# Patient Record
Sex: Male | Born: 1960 | Race: Black or African American | Hispanic: No | Smoking: Current some day smoker
Health system: Southern US, Community
[De-identification: ages and names within clinical notes are randomized; demographics above are authoritative.]

## PROBLEM LIST (undated history)

## (undated) DIAGNOSIS — E119 Type 2 diabetes mellitus without complications: Secondary | ICD-10-CM

## (undated) DIAGNOSIS — G629 Polyneuropathy, unspecified: Secondary | ICD-10-CM

## (undated) HISTORY — PX: BACK SURGERY: SHX140

## (undated) HISTORY — PX: NECK SURGERY: SHX720

---

## 1998-05-13 ENCOUNTER — Ambulatory Visit (HOSPITAL_BASED_OUTPATIENT_CLINIC_OR_DEPARTMENT_OTHER): Admission: RE | Admit: 1998-05-13 | Discharge: 1998-05-13 | Payer: Self-pay | Admitting: Orthopaedic Surgery

## 1999-01-13 ENCOUNTER — Encounter: Payer: Self-pay | Admitting: Emergency Medicine

## 1999-01-13 ENCOUNTER — Emergency Department (HOSPITAL_COMMUNITY): Admission: EM | Admit: 1999-01-13 | Discharge: 1999-01-13 | Payer: Self-pay | Admitting: Emergency Medicine

## 1999-01-25 ENCOUNTER — Emergency Department (HOSPITAL_COMMUNITY): Admission: EM | Admit: 1999-01-25 | Discharge: 1999-01-25 | Payer: Self-pay | Admitting: *Deleted

## 1999-02-12 ENCOUNTER — Inpatient Hospital Stay (HOSPITAL_COMMUNITY): Admission: AD | Admit: 1999-02-12 | Discharge: 1999-02-14 | Payer: Self-pay | Admitting: Internal Medicine

## 2000-01-15 ENCOUNTER — Inpatient Hospital Stay (HOSPITAL_COMMUNITY): Admission: RE | Admit: 2000-01-15 | Discharge: 2000-01-17 | Payer: Self-pay | Admitting: Orthopaedic Surgery

## 2001-03-16 ENCOUNTER — Encounter: Payer: Self-pay | Admitting: Internal Medicine

## 2001-03-16 ENCOUNTER — Emergency Department (HOSPITAL_COMMUNITY): Admission: EM | Admit: 2001-03-16 | Discharge: 2001-03-17 | Payer: Self-pay

## 2002-07-25 ENCOUNTER — Inpatient Hospital Stay (HOSPITAL_COMMUNITY): Admission: RE | Admit: 2002-07-25 | Discharge: 2002-07-25 | Payer: Self-pay | Admitting: Orthopaedic Surgery

## 2021-05-28 ENCOUNTER — Other Ambulatory Visit: Payer: Self-pay

## 2021-05-28 ENCOUNTER — Emergency Department (HOSPITAL_COMMUNITY): Payer: Medicare HMO

## 2021-05-28 ENCOUNTER — Emergency Department (HOSPITAL_BASED_OUTPATIENT_CLINIC_OR_DEPARTMENT_OTHER): Payer: Medicare HMO

## 2021-05-28 ENCOUNTER — Encounter (HOSPITAL_BASED_OUTPATIENT_CLINIC_OR_DEPARTMENT_OTHER): Payer: Self-pay

## 2021-05-28 ENCOUNTER — Emergency Department (HOSPITAL_BASED_OUTPATIENT_CLINIC_OR_DEPARTMENT_OTHER)
Admission: EM | Admit: 2021-05-28 | Discharge: 2021-05-28 | Disposition: A | Discharge status: Home / Self Care | Payer: Medicare HMO | Attending: Emergency Medicine | Admitting: Emergency Medicine

## 2021-05-28 DIAGNOSIS — R252 Cramp and spasm: Secondary | ICD-10-CM | POA: Insufficient documentation

## 2021-05-28 DIAGNOSIS — M549 Dorsalgia, unspecified: Secondary | ICD-10-CM | POA: Diagnosis present

## 2021-05-28 DIAGNOSIS — F17290 Nicotine dependence, other tobacco product, uncomplicated: Secondary | ICD-10-CM | POA: Diagnosis not present

## 2021-05-28 DIAGNOSIS — M4802 Spinal stenosis, cervical region: Secondary | ICD-10-CM | POA: Insufficient documentation

## 2021-05-28 DIAGNOSIS — M47812 Spondylosis without myelopathy or radiculopathy, cervical region: Secondary | ICD-10-CM | POA: Insufficient documentation

## 2021-05-28 DIAGNOSIS — E119 Type 2 diabetes mellitus without complications: Secondary | ICD-10-CM | POA: Diagnosis not present

## 2021-05-28 DIAGNOSIS — M47815 Spondylosis without myelopathy or radiculopathy, thoracolumbar region: Secondary | ICD-10-CM | POA: Diagnosis not present

## 2021-05-28 DIAGNOSIS — G9589 Other specified diseases of spinal cord: Secondary | ICD-10-CM | POA: Insufficient documentation

## 2021-05-28 DIAGNOSIS — M62838 Other muscle spasm: Secondary | ICD-10-CM

## 2021-05-28 HISTORY — DX: Polyneuropathy, unspecified: G62.9

## 2021-05-28 HISTORY — DX: Type 2 diabetes mellitus without complications: E11.9

## 2021-05-28 LAB — COMPREHENSIVE METABOLIC PANEL
ALT: 23 U/L (ref 0–44)
AST: 42 U/L — ABNORMAL HIGH (ref 15–41)
Albumin: 4.5 g/dL (ref 3.5–5.0)
Alkaline Phosphatase: 70 U/L (ref 38–126)
Anion gap: 10 (ref 5–15)
BUN: 15 mg/dL (ref 8–23)
CO2: 24 mmol/L (ref 22–32)
Calcium: 9.8 mg/dL (ref 8.9–10.3)
Chloride: 103 mmol/L (ref 98–111)
Creatinine, Ser: 1.05 mg/dL (ref 0.61–1.24)
GFR, Estimated: >60 mL/min (ref >60)
Glucose, Bld: 90 mg/dL (ref 70–99)
Potassium: 3.8 mmol/L (ref 3.5–5.1)
Sodium: 137 mmol/L (ref 135–145)
Total Bilirubin: 1.3 mg/dL — ABNORMAL HIGH (ref 0.3–1.2)
Total Protein: 8 g/dL (ref 6.5–8.1)

## 2021-05-28 LAB — MAGNESIUM: Magnesium: 1.8 mg/dL (ref 1.7–2.4)

## 2021-05-28 LAB — URINALYSIS, MICROSCOPIC (REFLEX)

## 2021-05-28 LAB — URINALYSIS, ROUTINE W REFLEX MICROSCOPIC
Glucose, UA: NEGATIVE mg/dL
Leukocytes,Ua: NEGATIVE
Nitrite: NEGATIVE
Protein, ur: NEGATIVE mg/dL
Specific Gravity, Urine: 1.03 (ref 1.005–1.030)
pH: 5.5 (ref 5.0–8.0)

## 2021-05-28 LAB — RAPID URINE DRUG SCREEN, HOSP PERFORMED
Amphetamines: NOT DETECTED
Barbiturates: NOT DETECTED
Benzodiazepines: NOT DETECTED
Cocaine: POSITIVE — AB
Opiates: NOT DETECTED
Tetrahydrocannabinol: POSITIVE — AB

## 2021-05-28 LAB — CBC WITH DIFFERENTIAL/PLATELET
Abs Immature Granulocytes: 0.01 10*3/uL (ref 0.00–0.07)
Basophils Absolute: 0 10*3/uL (ref 0.0–0.1)
Basophils Relative: 0 %
Eosinophils Absolute: 0.1 10*3/uL (ref 0.0–0.5)
Eosinophils Relative: 1 %
HCT: 45 % (ref 39.0–52.0)
Hemoglobin: 15.9 g/dL (ref 13.0–17.0)
Immature Granulocytes: 0 %
Lymphocytes Relative: 45 %
Lymphs Abs: 2 10*3/uL (ref 0.7–4.0)
MCH: 32.4 pg (ref 26.0–34.0)
MCHC: 35.3 g/dL (ref 30.0–36.0)
MCV: 91.8 fL (ref 80.0–100.0)
Monocytes Absolute: 0.5 10*3/uL (ref 0.1–1.0)
Monocytes Relative: 11 %
Neutro Abs: 2 10*3/uL (ref 1.7–7.7)
Neutrophils Relative %: 43 %
Platelets: 188 10*3/uL (ref 150–400)
RBC: 4.9 MIL/uL (ref 4.22–5.81)
RDW: 13.4 % (ref 11.5–15.5)
WBC: 4.5 10*3/uL (ref 4.0–10.5)
nRBC: 0 % (ref 0.0–0.2)

## 2021-05-28 LAB — VITAMIN B12: Vitamin B-12: 590 pg/mL (ref 180–914)

## 2021-05-28 LAB — ETHANOL: Alcohol, Ethyl (B): <10 mg/dL (ref <10)

## 2021-05-28 LAB — AMMONIA: Ammonia: 21 umol/L (ref 9–35)

## 2021-05-28 IMAGING — MR MR CERVICAL SPINE W/O CM
4 of 6 series · 18 of 48 positions shown · non-contrast
Comparison: Cervical spine radiographs [DATE], cervical spine
MRI [DATE]

CLINICAL DATA: Myelopathy

EXAM:
MRI CERVICAL SPINE WITHOUT CONTRAST
TECHNIQUE: Multiplanar, multisequence MR imaging of the cervical spine was
performed. No intravenous contrast was administered.

[Series 2: T2 · sagittal · 3.0mm · 0.43mm/px · 4 of 18 slices shown (1 of 3)]
[im 1/18]
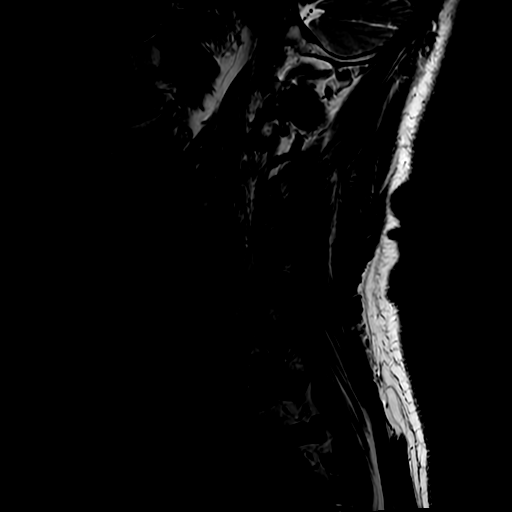
[im 6/18]
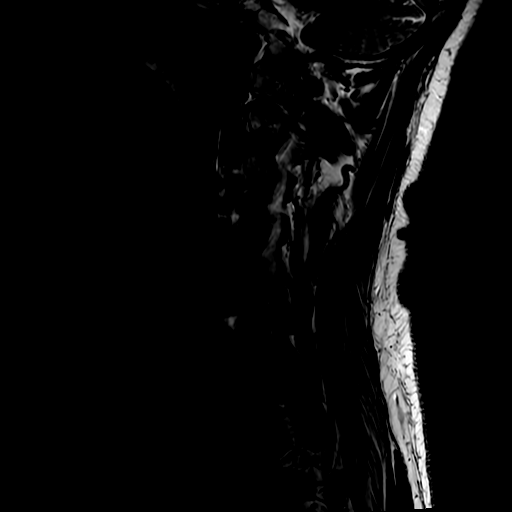
[im 12/18]
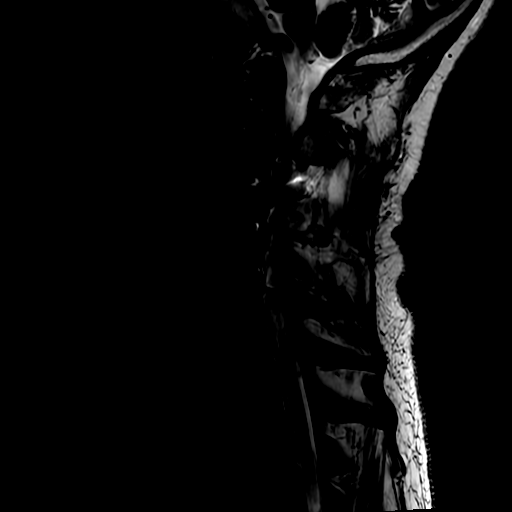
[im 18/18]
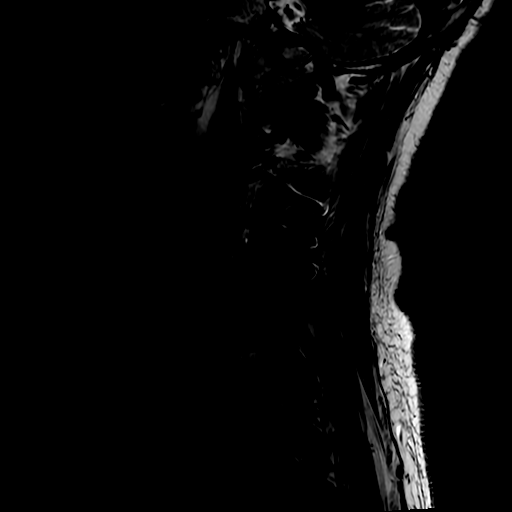

[Series 3: T2 · sagittal · 3.0mm · 0.43mm/px · 3 of 18 slices shown (2 of 3)]
[im 1/18]
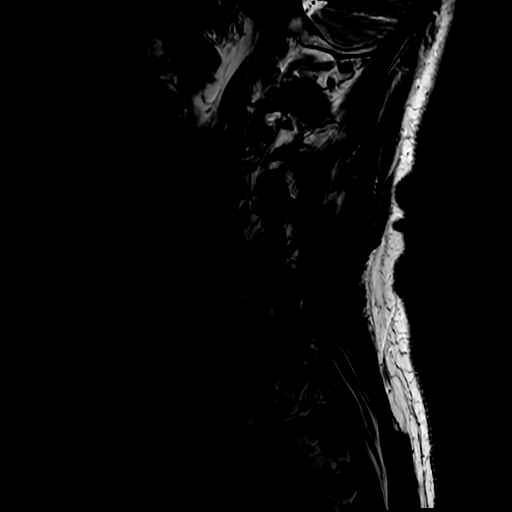
[im 9/18]
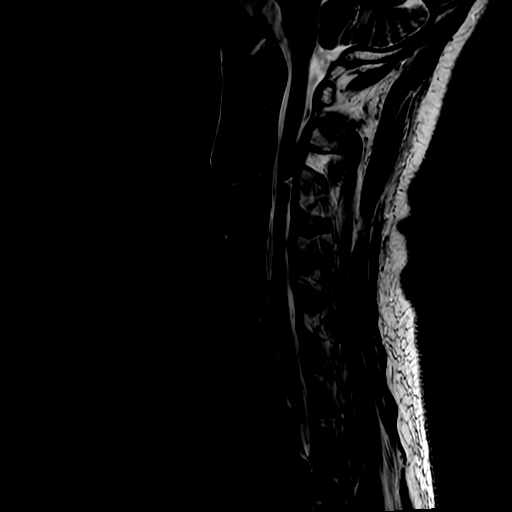
[im 18/18]
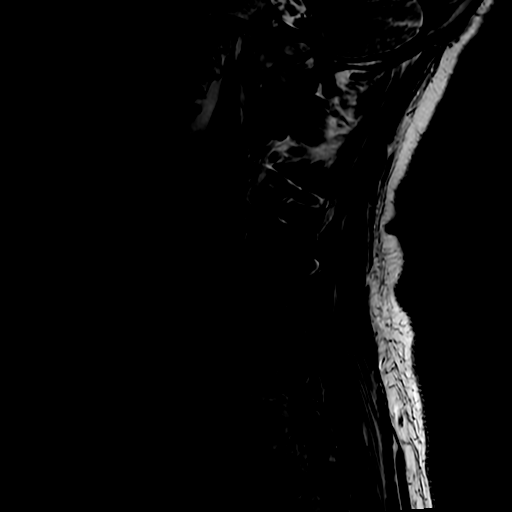

[Series 5: STIR · sagittal · 3.0mm · 0.43mm/px · 3 of 18 slices shown]
[im 1/18]
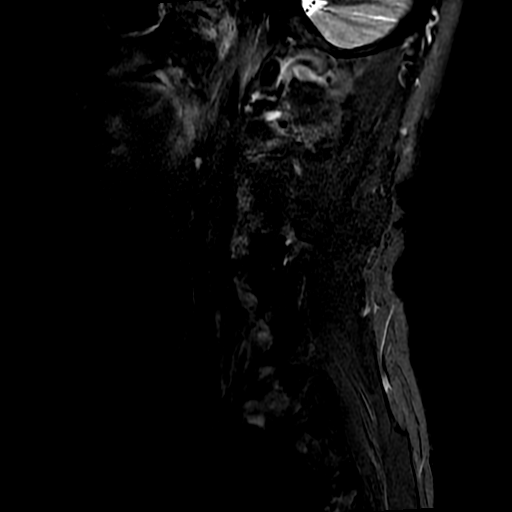
[im 9/18]
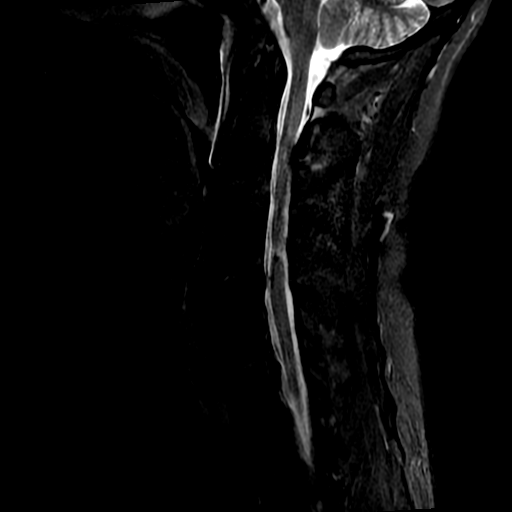
[im 18/18]
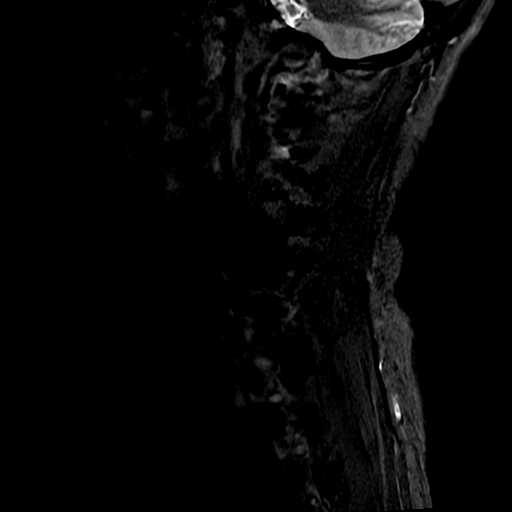

[Series 7: T2 · axial · 3.0mm · 0.35mm/px · z∈[-106,+29]mm · 8 of 49 slices shown (3 of 3)]
[im 1/49]
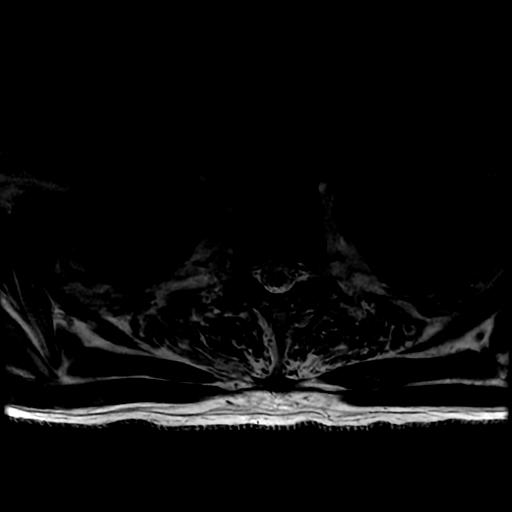
[im 7/49]
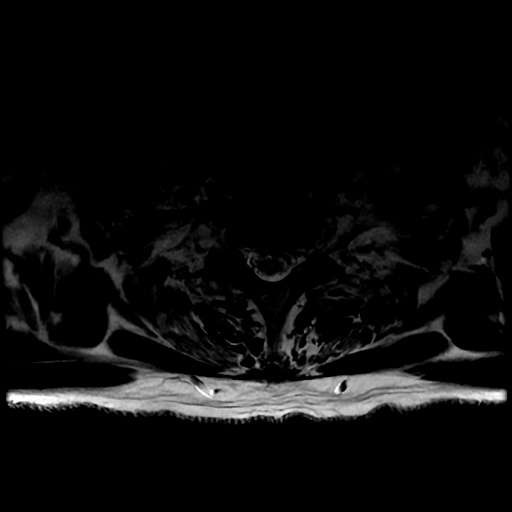
[im 13/49]
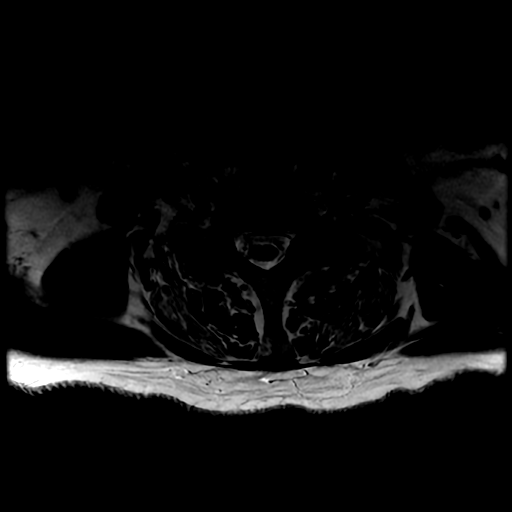
[im 19/49]
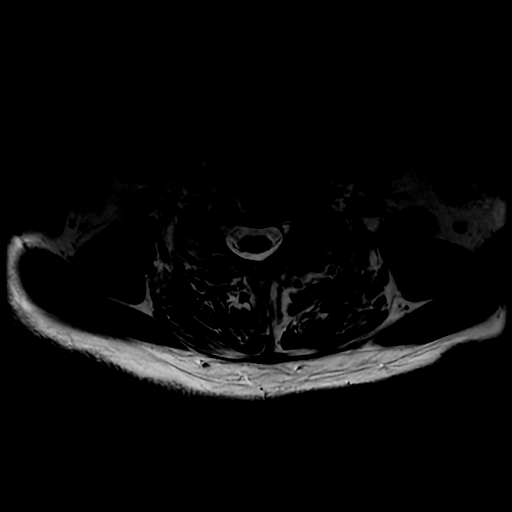
[im 25/49]
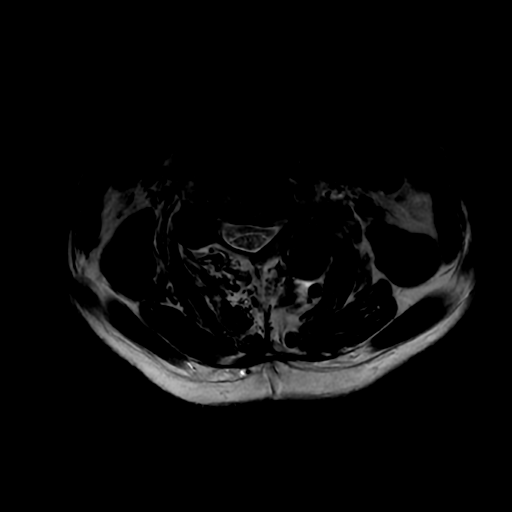
[im 31/49]
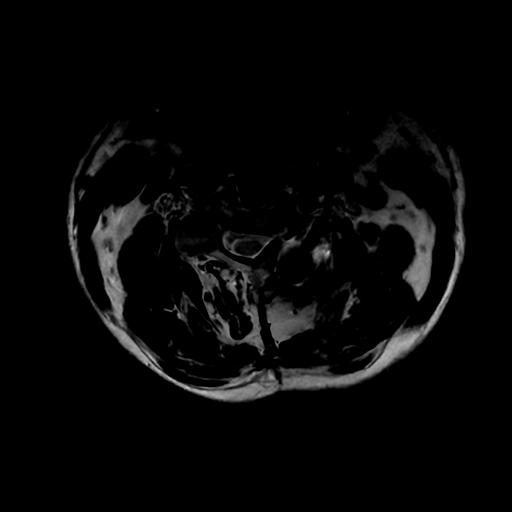
[im 37/49]
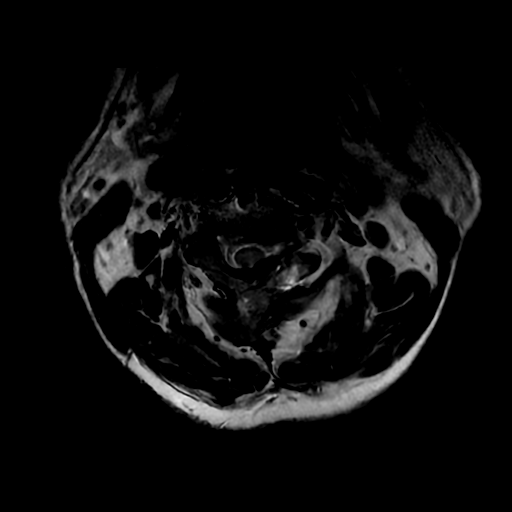
[im 43/49]
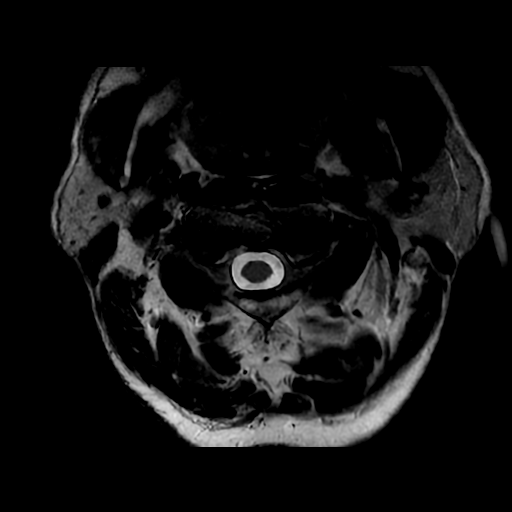

[18 of 48 positions shown; findings below may reference images not displayed]

FINDINGS: Alignment: Normal.

Vertebrae: The patient is status post ACDF at C3 through C5 and
C6-C7. There appears to be good osseous fusion across the fused
levels. Left posterior fusion hardware is also seen at C3 through
C5. Marrow signal is normal, allowing for these postsurgical
changes. Vertebral body heights are preserved.

Cord: Myelomalacia is again seen at C5, similar to the prior study.
There is no new cord signal abnormality.

Posterior Fossa, vertebral arteries, paraspinal tissues: The imaged
portions of the posterior fossa are unremarkable. The vertebral
artery flow voids are present. The paraspinal soft tissues are
unremarkable.

Disc levels:

There is disc desiccation and narrowing at the unfused levels,
similar to the prior study from [82].

C2-C3: There is left-sided ligamentum flavum thickening and
uncovertebral and facet arthropathy resulting in severe left and
moderate right neural foraminal stenosis and mild spinal canal
stenosis, progressed since [82].

C3-C4: There is solid fusion at this level. There is uncovertebral
and facet arthropathy resulting in moderate bilateral neural
foraminal stenosis without significant spinal canal stenosis, not
significantly changed.

C4-C5: There is solid fusion at this level. There is left worse than
right facet arthropathy resulting in severe left and moderate right
neural foraminal stenosis, not significantly changed.

C5-C6: There is a mild posterior disc osteophyte complex and
uncovertebral and facet arthropathy resulting in severe left worse
than right neural foraminal stenosis without significant spinal
canal stenosis, not significantly changed.

C6-C7: There is solid fusion at this level. There is bilateral facet
arthropathy resulting in mild-to-moderate bilateral neural foraminal
stenosis without significant spinal canal stenosis., similar to the
prior study.

C7-T1: There is uncovertebral and facet arthropathy resulting in
severe left and moderate right neural foraminal stenosis without
significant spinal canal stenosis.

Other: There is a 0.9 cm right thyroid nodule. No specific follow-up
is required.
IMPRESSION: 1. Postsurgical changes reflecting C3 through C5 and C6-C7 ACDF and
posterior fusion on the left at C3 through C5. Hardware alignment is
within expected limits.
2. Unchanged chronic myelomalacia at C5. No new or progressive cord
signal abnormality. No significant spinal canal stenosis.
3. Multilevel severe neural foraminal stenosis including on the left
at C2-C3 and C4-C5, bilaterally at C5-C6, and on the left at C7-T1.
This is progressed on the left at C2-C3 since [82] but not
significantly changed at the other levels.

## 2021-05-28 IMAGING — MR MR THORACIC SPINE W/O CM
4 of 6 series · 18 of 48 positions shown · non-contrast
Comparison: MRI from [DATE].

CLINICAL DATA: Initial evaluation for myelopathy, acute or
progressive.

EXAM:
MRI THORACIC SPINE WITHOUT CONTRAST
TECHNIQUE: Multiplanar, multisequence MR imaging of the thoracic spine was
performed. No intravenous contrast was administered.

[Series 2: T1 · sagittal · 3.0mm · 0.90mm/px · 3 of 16 slices shown (1 of 2)]
[im 1/16]
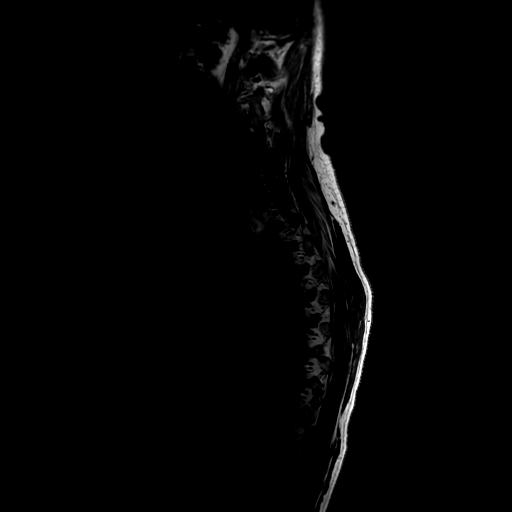
[im 11/16]
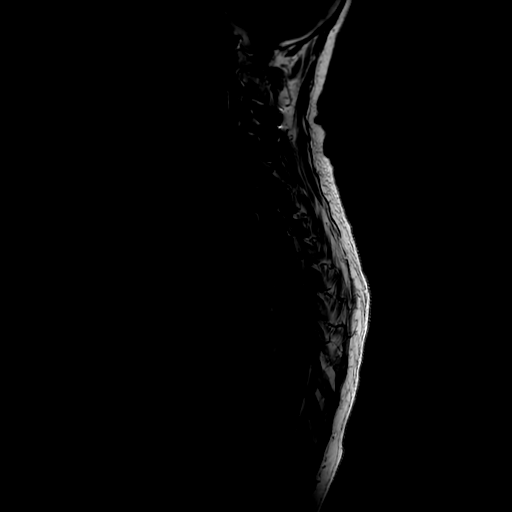
[im 16/16]
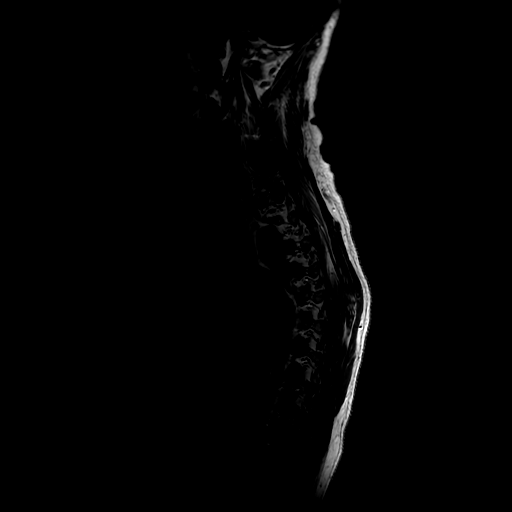

[Series 3: T2 · sagittal · 3.0mm · 0.66mm/px · 4 of 15 slices shown (1 of 2)]
[im 1/15]
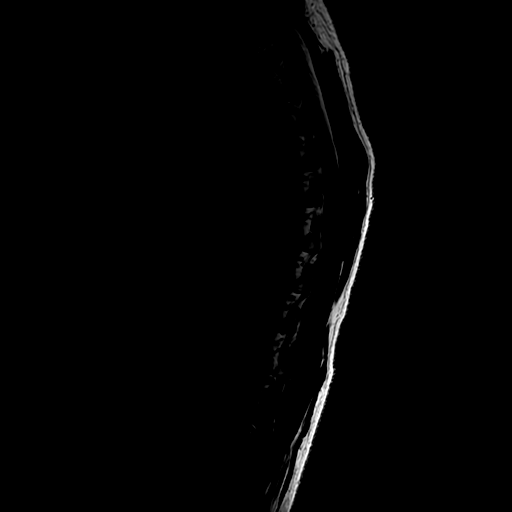
[im 5/15]
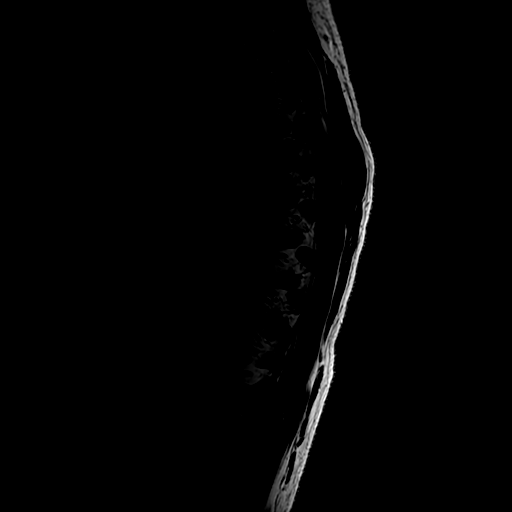
[im 10/15]
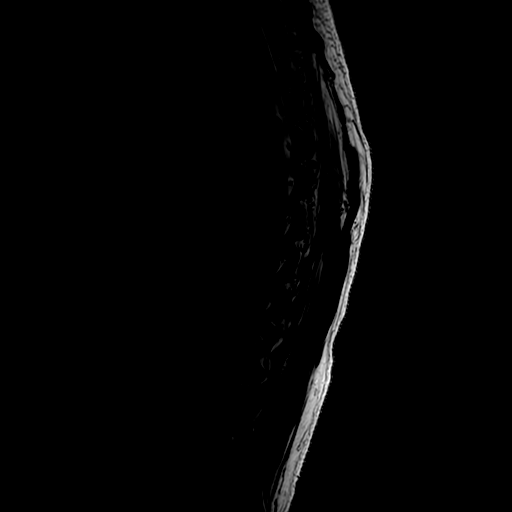
[im 15/15]
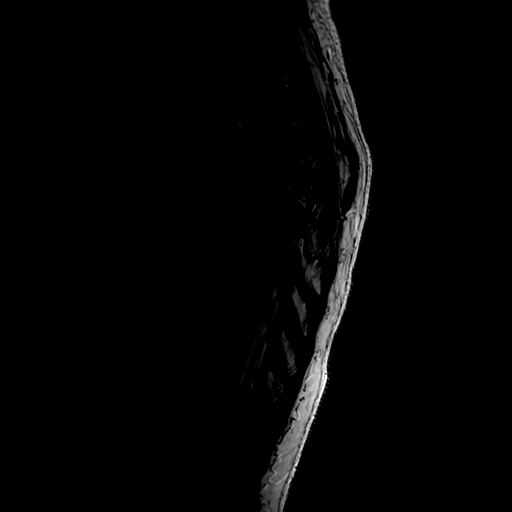

[Series 5: T1 · sagittal · 3.0mm · 0.66mm/px · 3 of 15 slices shown (2 of 2)]
[im 1/15]
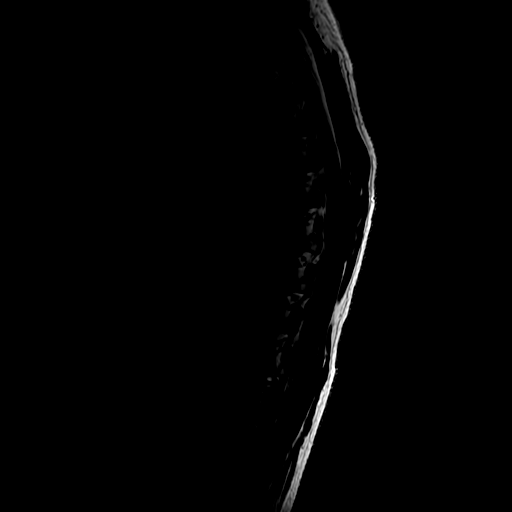
[im 10/15]
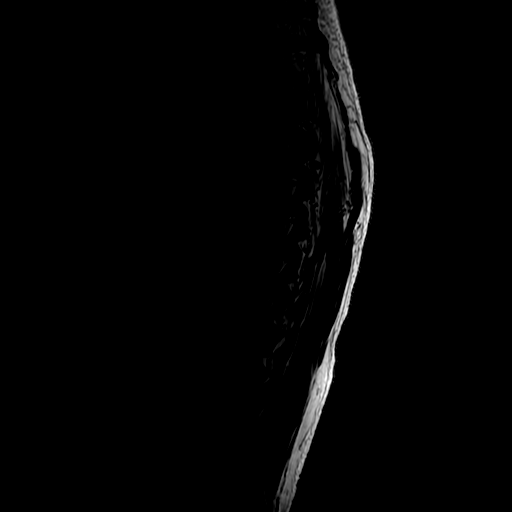
[im 15/15]
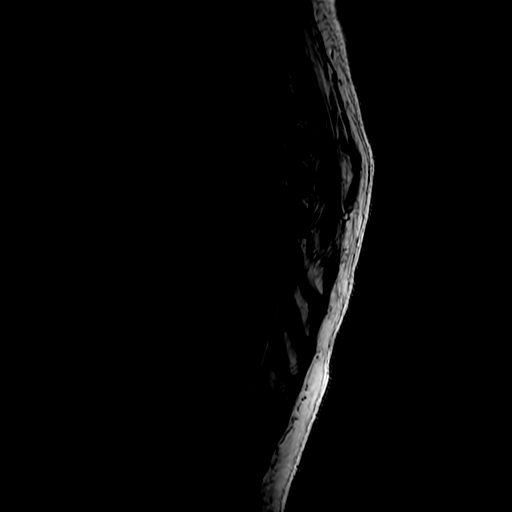

[Series 6: T2 · axial · 4.0mm · 0.39mm/px · z∈[-354,-99]mm · 8 of 60 slices shown (2 of 2)]
[im 4/60]
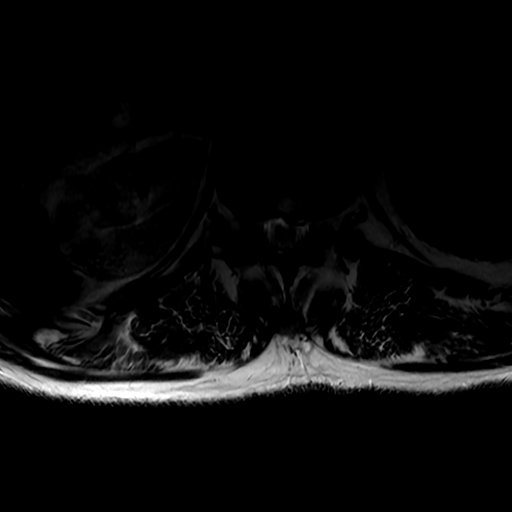
[im 8/60]
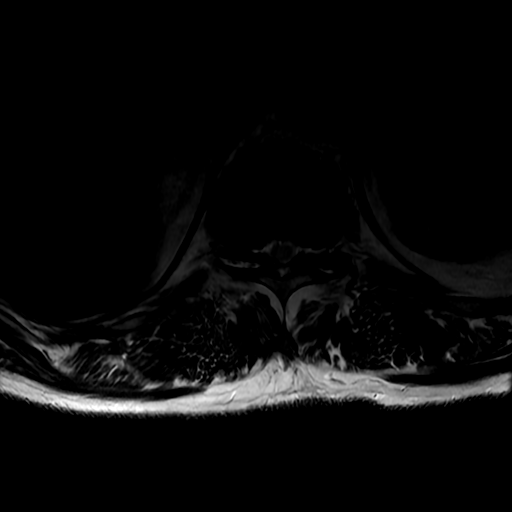
[im 12/60]
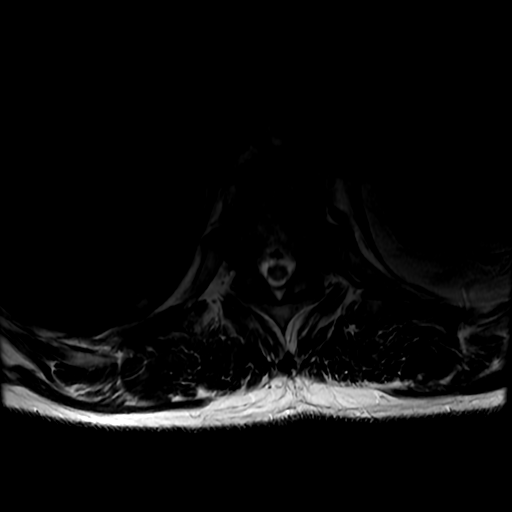
[im 20/60]
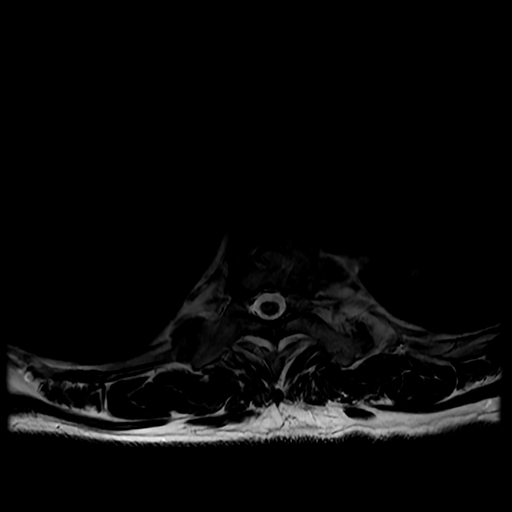
[im 28/60]
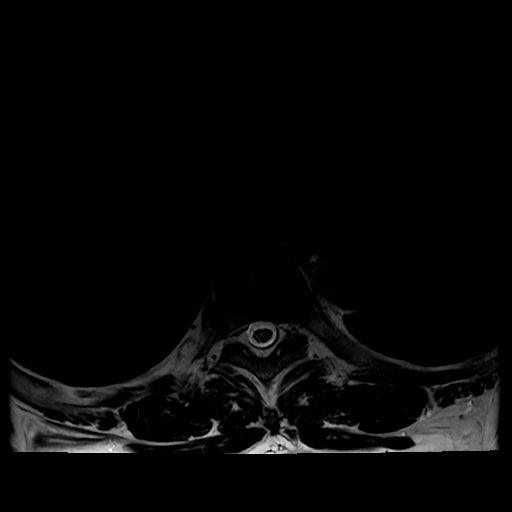
[im 32/60]
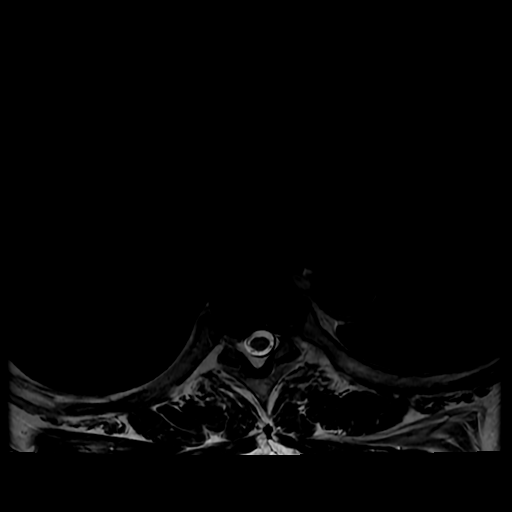
[im 36/60]
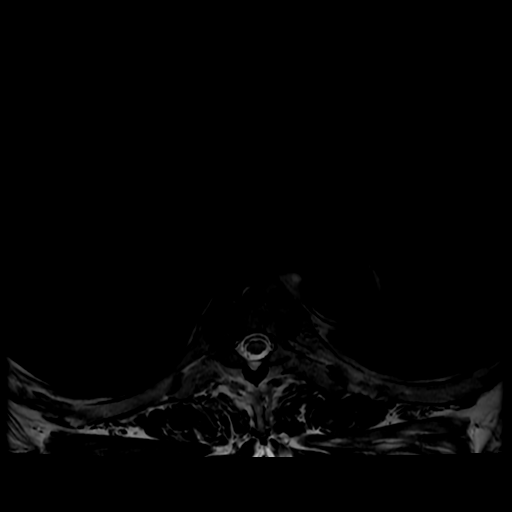
[im 52/60]
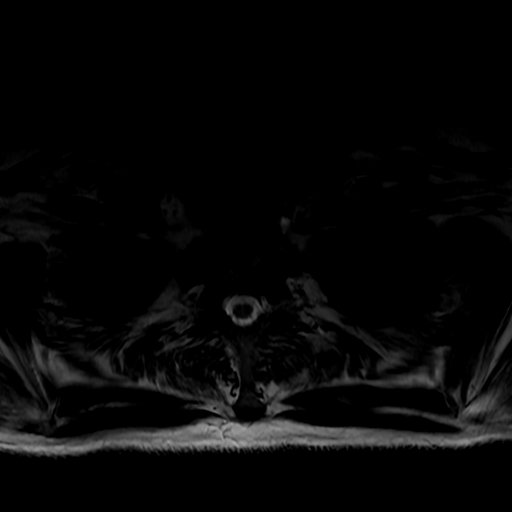

[18 of 48 positions shown; findings below may reference images not displayed]

FINDINGS: Alignment: Trace anterolisthesis of T11 on T12, chronic and facet
mediated. Alignment otherwise normal preservation of the normal
thoracic kyphosis.

Vertebrae: Vertebral body height maintained without acute or chronic
fracture. Bone marrow signal intensity within normal limits. 1.7 cm
hemangioma noted within the T10 vertebral body. No other discrete or
worrisome osseous lesions. No abnormal marrow edema.

Cord:  Normal signal and morphology.

Paraspinal and other soft tissues: Paraspinous soft tissues within
normal limits. Few cystic lesions seen within the visualized liver,
largest of which measures 1.2 cm.

Disc levels:

T1-2: Minimal disc bulge with bilateral facet hypertrophy. No spinal
stenosis. Moderate right foraminal narrowing.

T2-3: Disc bulge. Right worse than left facet hypertrophy. No spinal
stenosis. Foramina remain patent.

T3-4: Mild disc bulge with posterior element hypertrophy. No
stenosis.

T4-5: Minimal disc bulge with posterior element hypertrophy. No
stenosis.

T5-6: Minimal disc bulge with posterior element hypertrophy. No
stenosis.

T6-7: Small right paracentral disc protrusion minimally indents the
right ventral thecal sac. No spinal stenosis or cord impingement.
Foramina remain patent.

T7-8: Disc desiccation with mild posterior element hypertrophy. No
stenosis.

T8-9: Disc desiccation with mild posterior hypertrophy. No stenosis.

T9-10: Mild disc bulge with bilateral facet hypertrophy. No
significant stenosis.

T10-11: Disc bulge with disc desiccation and reactive endplate
change. Disc bulging slightly eccentric to the left. Bilateral facet
hypertrophy. No significant spinal stenosis. Foramina remain patent.

T11-12: Trace anterolisthesis. Diffuse disc bulge with reactive
endplate spurring. Moderate bilateral facet hypertrophy with
associated small joint effusions. Resultant mild-to-moderate spinal
stenosis. Minimal cord flattening without cord signal changes.
Moderate right with mild left foraminal narrowing.

T12-L1: Disc bulge with disc desiccation. Superimposed left
paracentral disc protrusion indents the left ventral thecal sac
(series 6, image 57). Bilateral facet hypertrophy. Resultant mild to
moderate spinal stenosis, greater on the left. Foramina remain
patent.
IMPRESSION: 1. No acute abnormality within the thoracic spine. No evidence for
cord compression or myelopathy.
2. Multilevel thoracic spondylosis and facet arthrosis as above,
most pronounced at T11-12 and T12-L1 where there is resultant
mild-to-moderate spinal stenosis. Associated moderate foraminal
narrowing on the right at T1-2 and T11-12.

## 2021-05-28 IMAGING — CT CT L SPINE W/O CM
3 series · 9 of 33 positions shown, 10 images · non-contrast
Comparison: None.

CLINICAL DATA: Low back pain with progressive neurological deficit.
Neuropathy of both lower extremities.

EXAM:
CT LUMBAR SPINE WITHOUT CONTRAST
TECHNIQUE: Multidetector CT imaging of the lumbar spine was performed without
intravenous contrast administration. Multiplanar CT image
reconstructions were also generated.

[Series 4: l spine soft · axial · 0.28mm/px · z∈[+1060,+1060]mm · 1 of 128 slices shown, 2 images]
[im 69/128  soft-tissue]
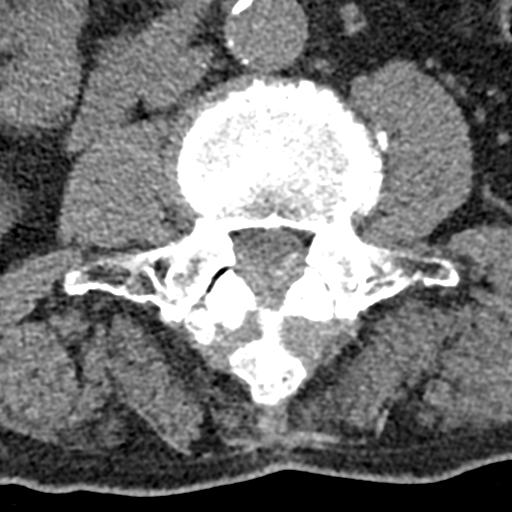
[im 69/128  bone]
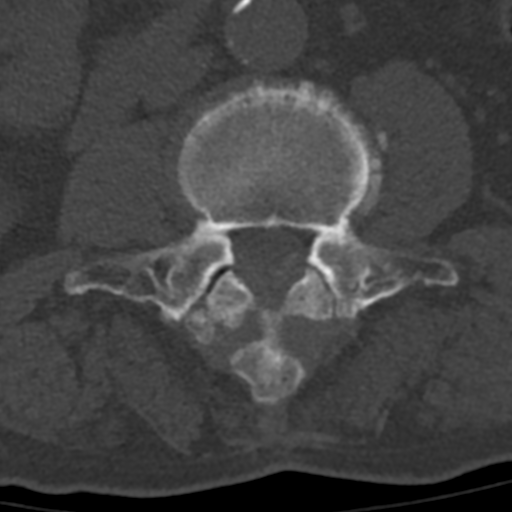

[Series 7: sagittal soft · sagittal · 0.24mm/px · 5 of 74 slices shown]
[im 25/74  bone]
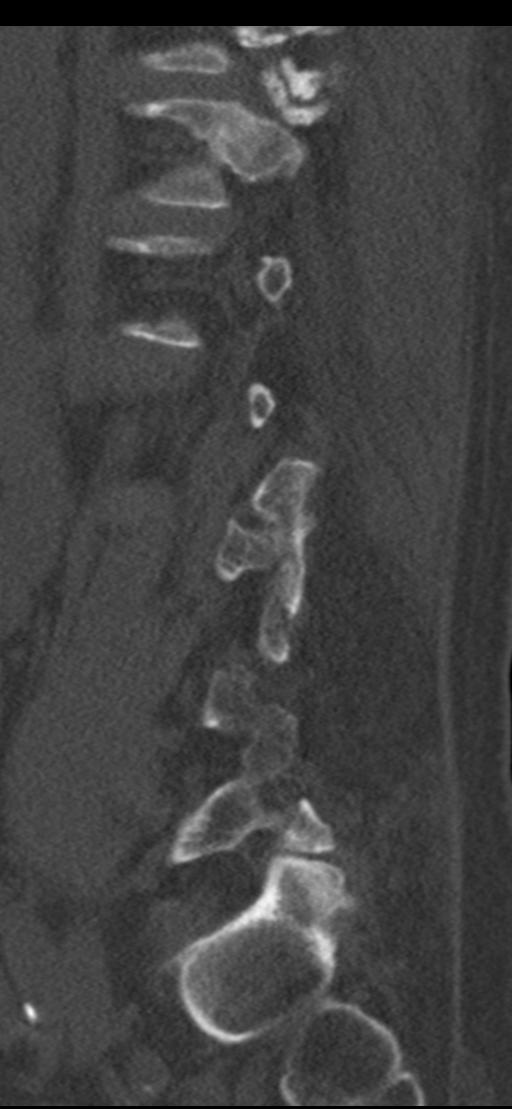
[im 31/74  bone]
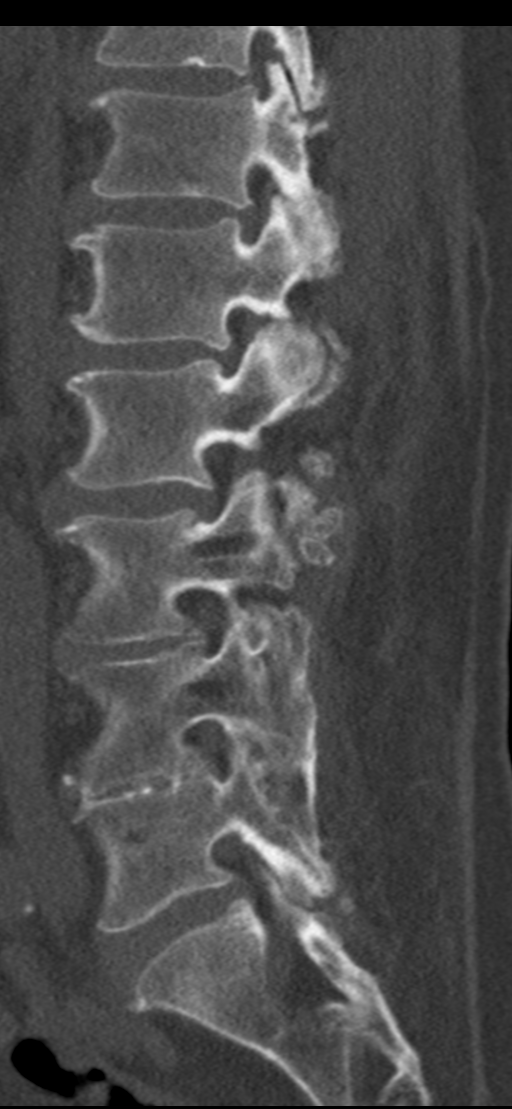
[im 37/74  bone]
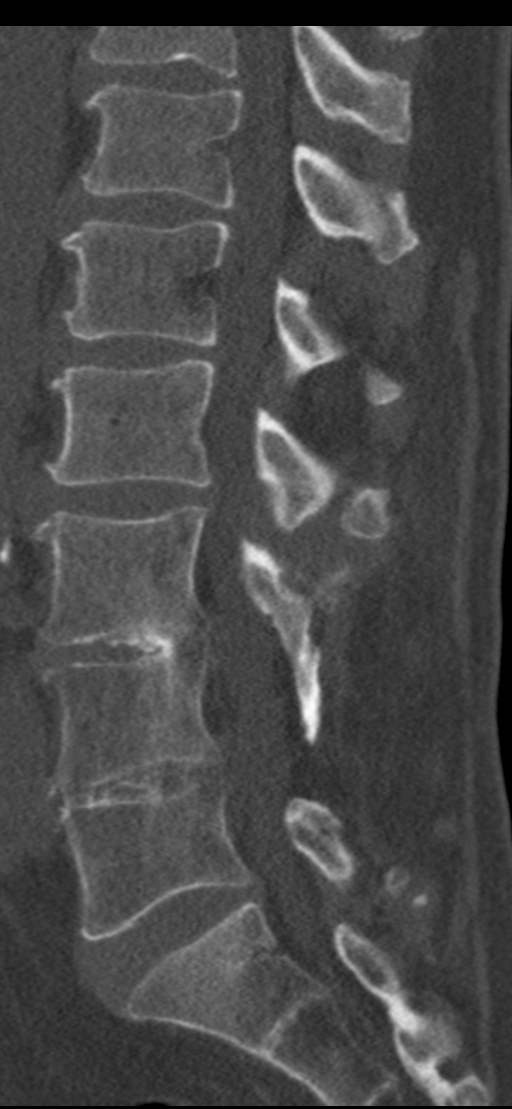
[im 43/74  bone]
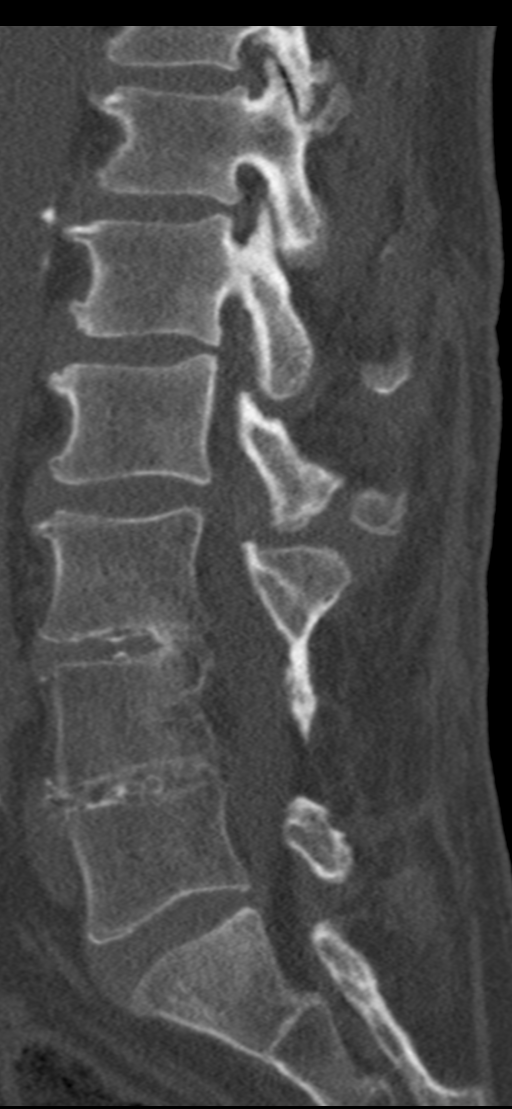
[im 49/74  bone]
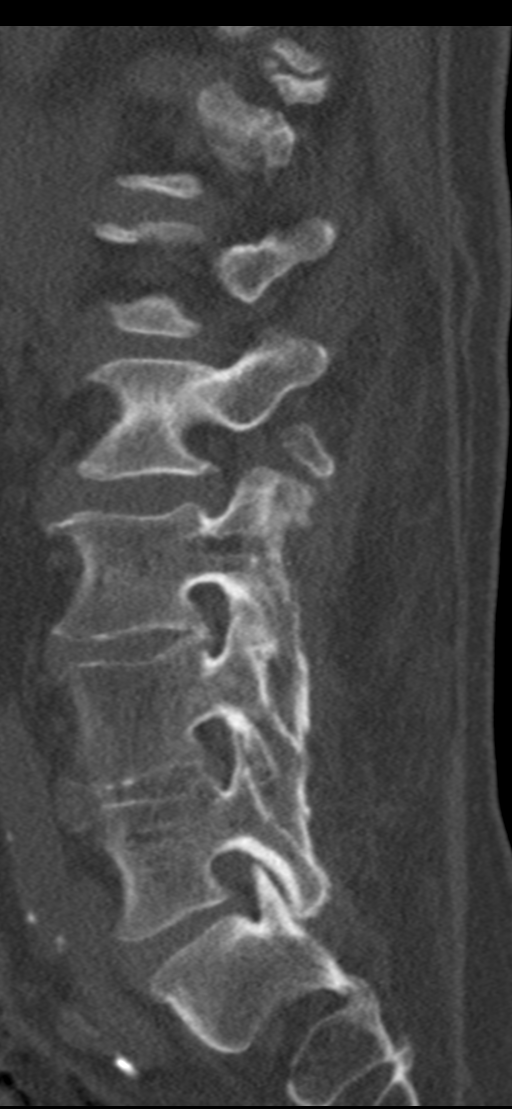

[Series 8: coronal bone · coronal · 0.23mm/px · 3 of 51 slices shown]
[im 11/51  bone]
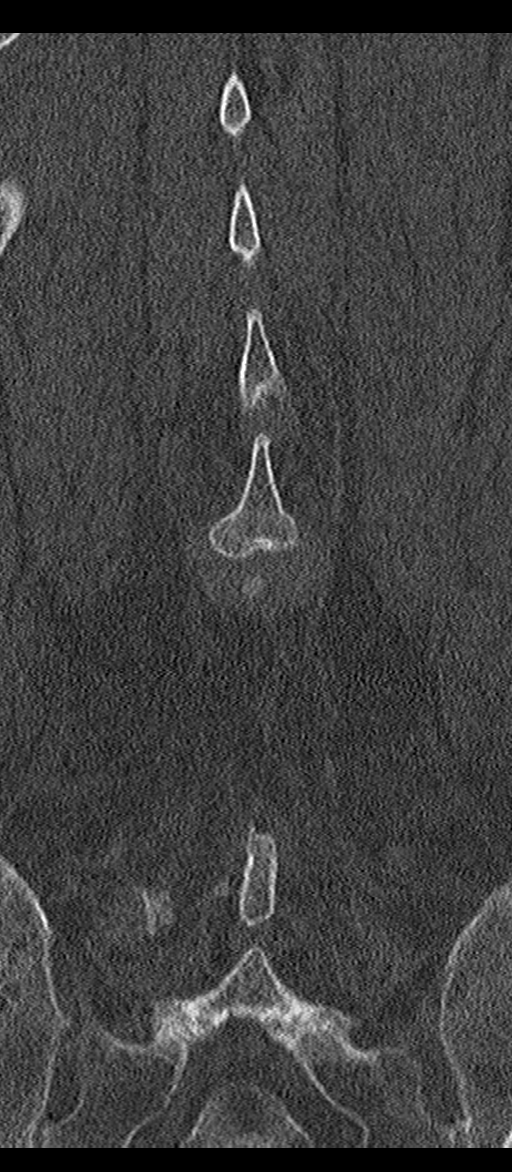
[im 21/51  bone]
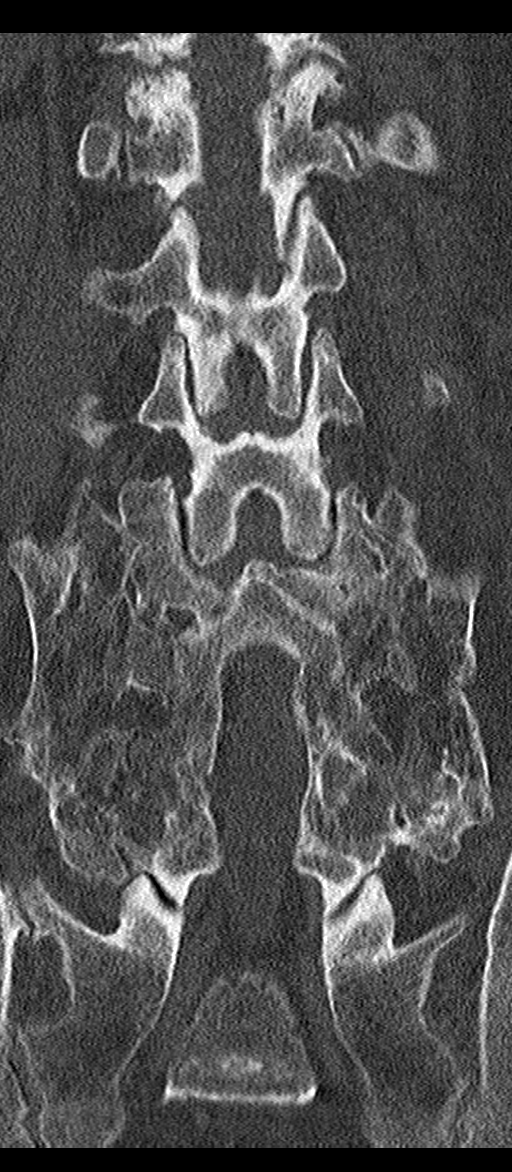
[im 31/51  bone]
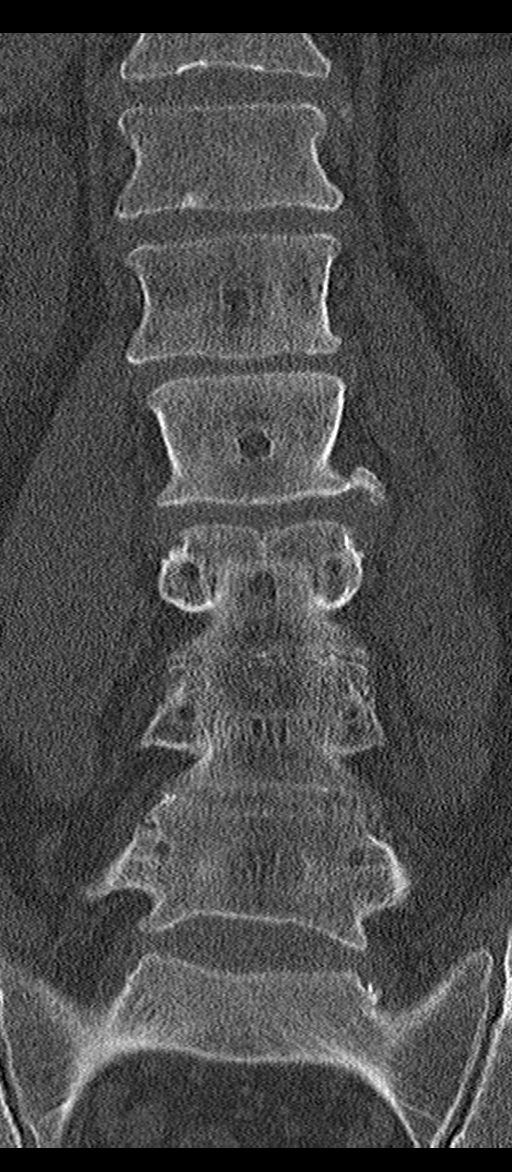

[9 of 33 positions shown; findings below may reference images not displayed]

FINDINGS: Segmentation: 5 lumbar type vertebral bodies assumed.

Alignment: No malalignment.

Vertebrae: Previous fusion procedure from L3 through L5 with
hardware removal. See below.

Paraspinal and other soft tissues: Negative

Disc levels: T11-12: Facet osteoarthritis with 1 or 2 mm of
anterolisthesis. Bulging of the disc. Facet and ligamentous
hypertrophy. Moderate canal and foraminal stenosis.

T12-L1: Bulging of the disc. Mild facet and ligamentous prominence.
Mild canal stenosis.

L1-2: Bulging of the disc. Mild facet and ligamentous hypertrophy.
Mild canal stenosis.

L2-3: Moderate bulging of the disc. Facet and ligamentous
hypertrophy. Moderate multifactorial stenosis that could be
symptomatic.

L3 through L5: Previous posterior decompression, diskectomy and
fusion. Solid union throughout this region with sufficient patency
of the canal and foramina.

L5-S1: Mild bulging of the disc. Bilateral facet osteoarthritis. No
compressive stenosis.
IMPRESSION: Previous posterior decompression, diskectomy and fusion procedure
from L3 through L5. Previous hardware removal. Solid union with
sufficient patency of the canal and foramina.

L2-3: Circumferential bulging of the disc. Facet and ligamentous
hypertrophy. Multifactorial spinal stenosis at this level that could
be symptomatic.

L1-2 and T12-L1: Mild disc bulges and facet hypertrophy. Mild
stenosis, not grossly compressive.

T11-12: Facet arthropathy with 1 or 2 mm of anterolisthesis. Bulging
of the disc. Canal and foraminal stenosis that could possibly be
symptomatic.

L5-S1: Minimal disc bulge. Facet osteoarthritis. No compressive
stenosis. The facet arthritis could be painful.

## 2021-05-28 IMAGING — MR MR THORACIC SPINE W/O CM
4 series · 18 of 48 positions shown · non-contrast
Comparison: MRI from [DATE].

CLINICAL DATA: Initial evaluation for myelopathy, acute or
progressive.

EXAM:
MRI THORACIC SPINE WITHOUT CONTRAST
TECHNIQUE: Multiplanar, multisequence MR imaging of the thoracic spine was
performed. No intravenous contrast was administered.

[Series 2: T1 · sagittal · 3.0mm · 0.90mm/px · 3 of 21 slices shown (1 of 2)]
[im 4/21]
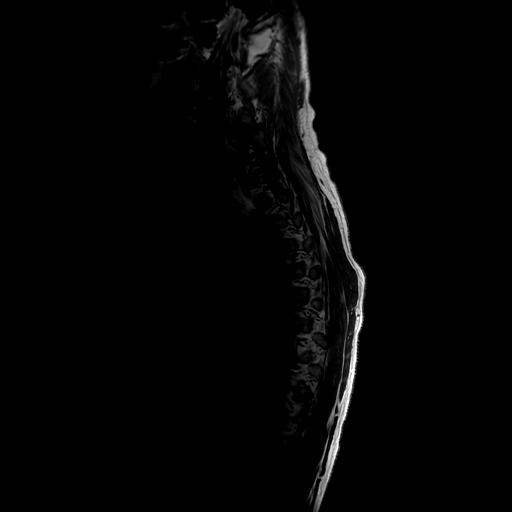
[im 11/21]
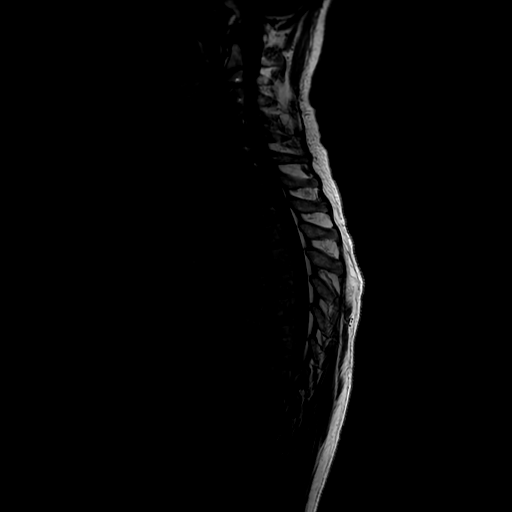
[im 19/21]
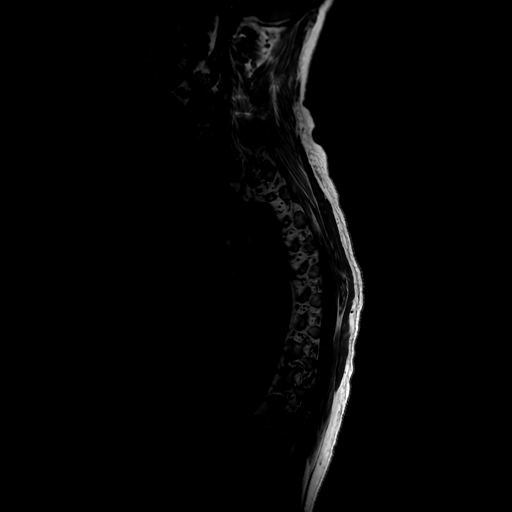

[Series 4: STIR · sagittal · 3.0mm · 0.66mm/px · 3 of 22 slices shown]
[im 4/22]
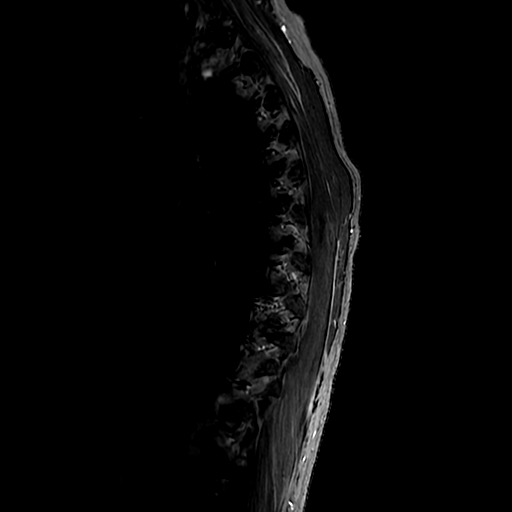
[im 12/22]
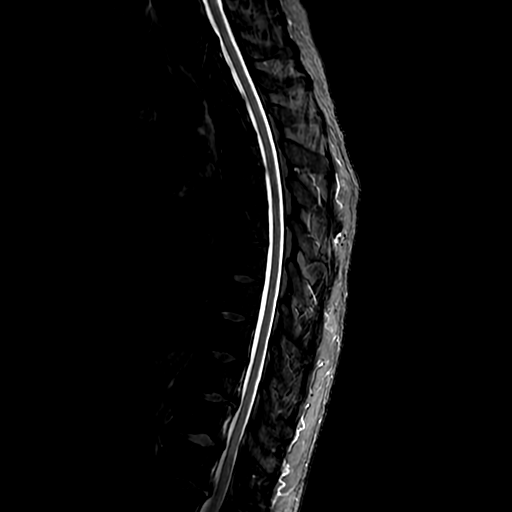
[im 20/22]
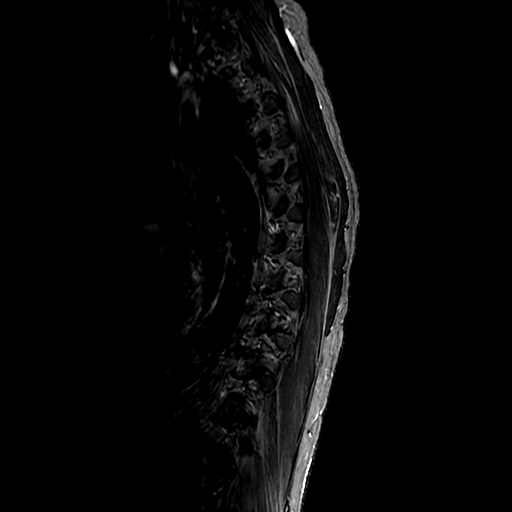

[Series 5: T2 · sagittal · 3.0mm · 0.66mm/px · 9 of 22 slices shown]
[im 1/22]
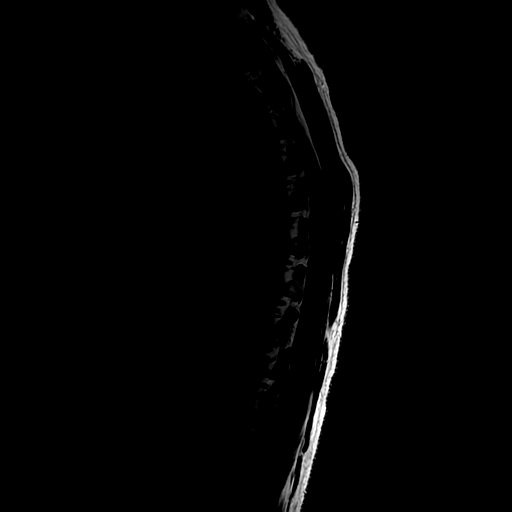
[im 4/22]
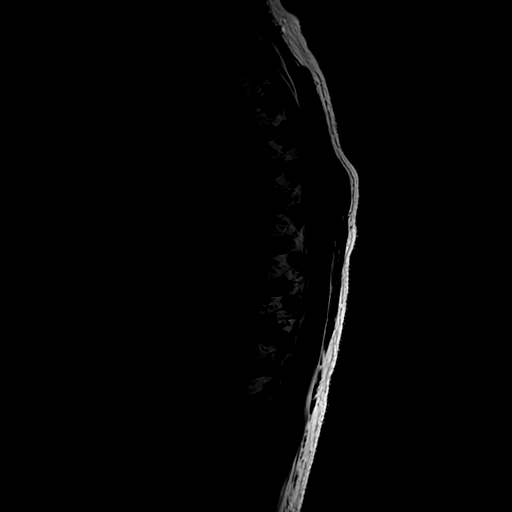
[im 6/22]
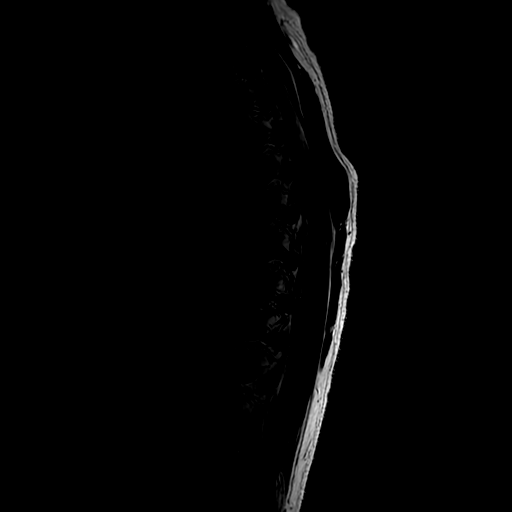
[im 10/22]
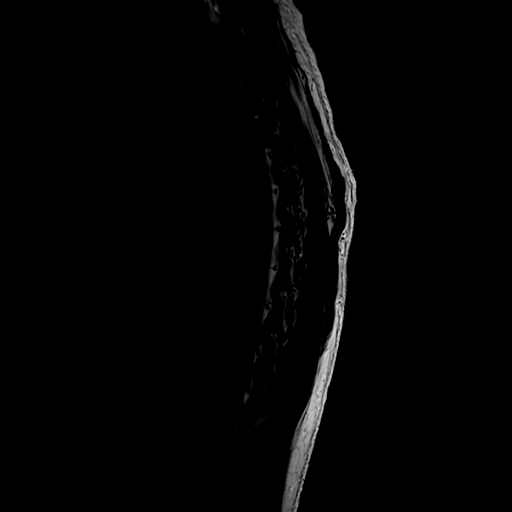
[im 12/22]
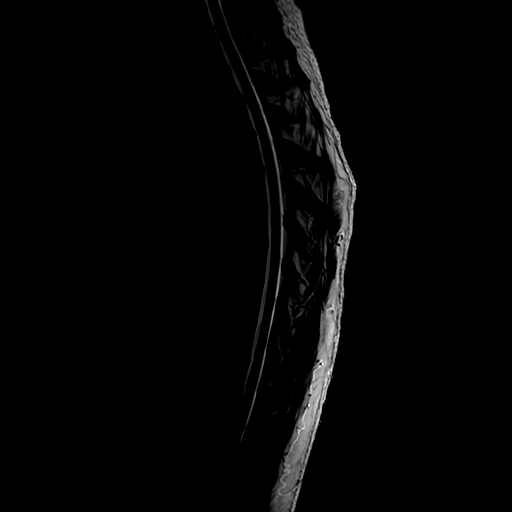
[im 16/22]
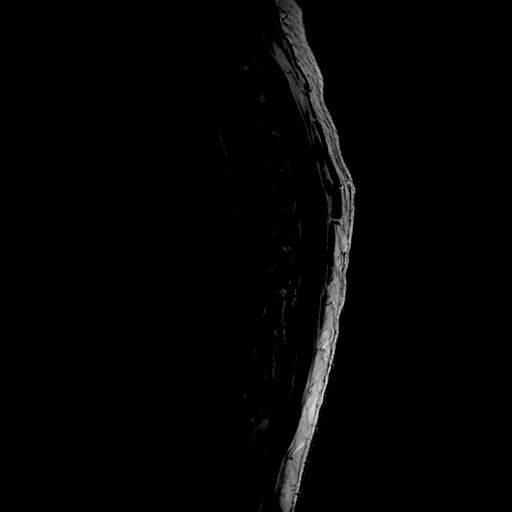
[im 18/22]
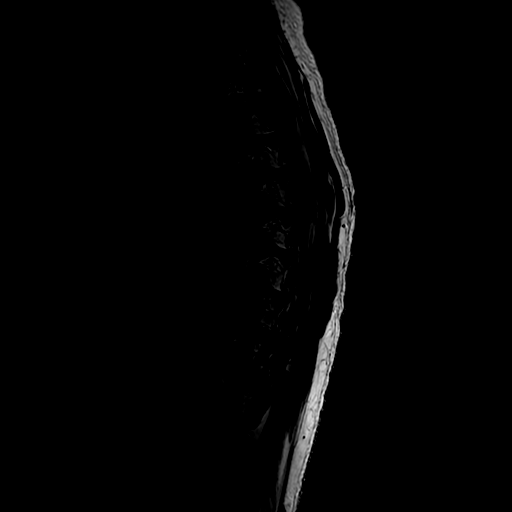
[im 20/22]
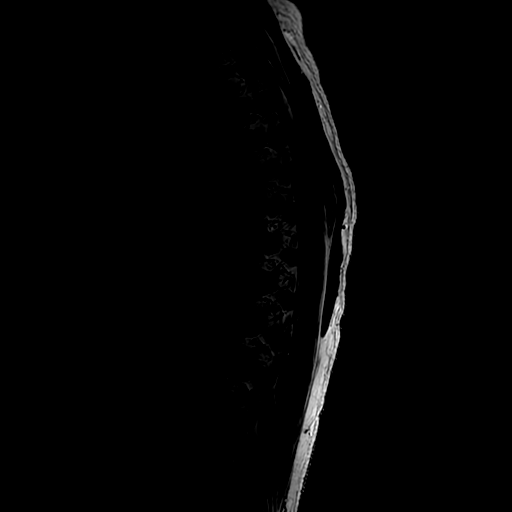
[im 22/22]
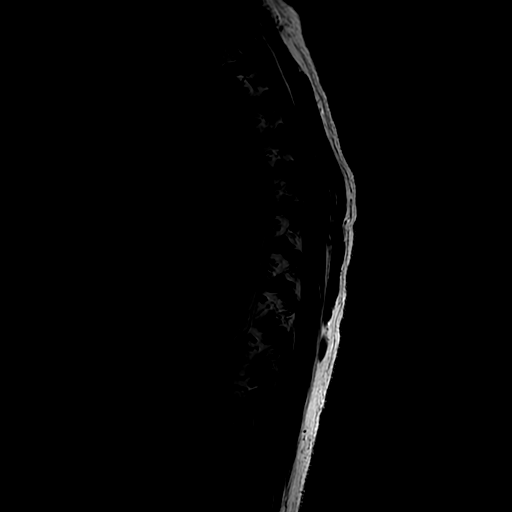

[Series 6: T1 · sagittal · 3.0mm · 0.66mm/px · 3 of 21 slices shown (2 of 2)]
[im 4/21]
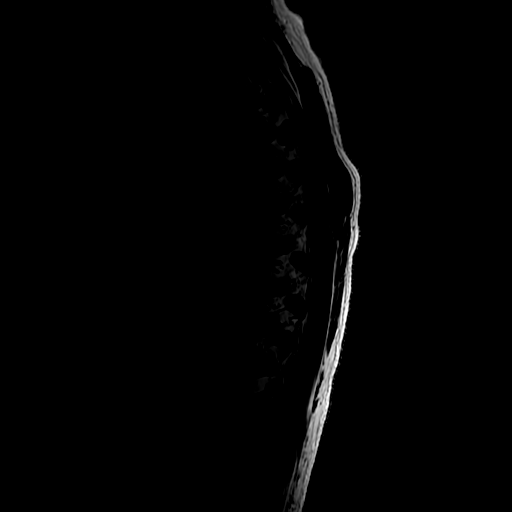
[im 11/21]
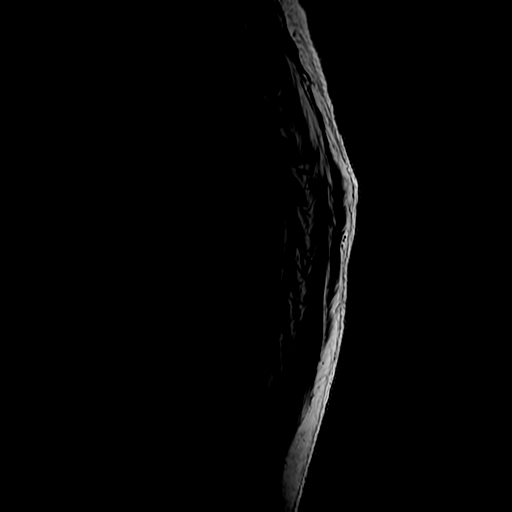
[im 19/21]
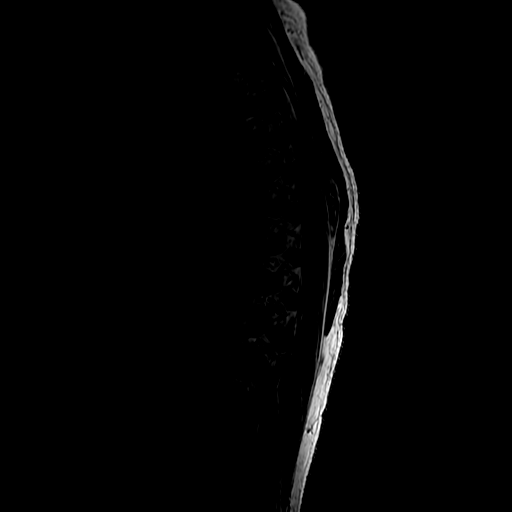

[18 of 48 positions shown; findings below may reference images not displayed]

FINDINGS: Alignment: Trace anterolisthesis of T11 on T12, chronic and facet
mediated. Alignment otherwise normal preservation of the normal
thoracic kyphosis.

Vertebrae: Vertebral body height maintained without acute or chronic
fracture. Bone marrow signal intensity within normal limits. 1.7 cm
hemangioma noted within the T10 vertebral body. No other discrete or
worrisome osseous lesions. No abnormal marrow edema.

Cord:  Normal signal and morphology.

Paraspinal and other soft tissues: Paraspinous soft tissues within
normal limits. Few cystic lesions seen within the visualized liver,
largest of which measures 1.2 cm.

Disc levels:

T1-2: Minimal disc bulge with bilateral facet hypertrophy. No spinal
stenosis. Moderate right foraminal narrowing.

T2-3: Disc bulge. Right worse than left facet hypertrophy. No spinal
stenosis. Foramina remain patent.

T3-4: Mild disc bulge with posterior element hypertrophy. No
stenosis.

T4-5: Minimal disc bulge with posterior element hypertrophy. No
stenosis.

T5-6: Minimal disc bulge with posterior element hypertrophy. No
stenosis.

T6-7: Small right paracentral disc protrusion minimally indents the
right ventral thecal sac. No spinal stenosis or cord impingement.
Foramina remain patent.

T7-8: Disc desiccation with mild posterior element hypertrophy. No
stenosis.

T8-9: Disc desiccation with mild posterior hypertrophy. No stenosis.

T9-10: Mild disc bulge with bilateral facet hypertrophy. No
significant stenosis.

T10-11: Disc bulge with disc desiccation and reactive endplate
change. Disc bulging slightly eccentric to the left. Bilateral facet
hypertrophy. No significant spinal stenosis. Foramina remain patent.

T11-12: Trace anterolisthesis. Diffuse disc bulge with reactive
endplate spurring. Moderate bilateral facet hypertrophy with
associated small joint effusions. Resultant mild-to-moderate spinal
stenosis. Minimal cord flattening without cord signal changes.
Moderate right with mild left foraminal narrowing.

T12-L1: Disc bulge with disc desiccation. Superimposed left
paracentral disc protrusion indents the left ventral thecal sac
(series 6, image 57). Bilateral facet hypertrophy. Resultant mild to
moderate spinal stenosis, greater on the left. Foramina remain
patent.
IMPRESSION: 1. No acute abnormality within the thoracic spine. No evidence for
cord compression or myelopathy.
2. Multilevel thoracic spondylosis and facet arthrosis as above,
most pronounced at T11-12 and T12-L1 where there is resultant
mild-to-moderate spinal stenosis. Associated moderate foraminal
narrowing on the right at T1-2 and T11-12.

## 2021-05-28 MED ORDER — DIAZEPAM 5 MG PO TABS
5.0000 mg | ORAL_TABLET | Freq: Once | ORAL | Status: AC
  Administered 2021-05-28: 5 mg via ORAL
  Filled 2021-05-28: qty 1

## 2021-05-28 MED ORDER — FENTANYL CITRATE PF 50 MCG/ML IJ SOSY
50.0000 ug | PREFILLED_SYRINGE | Freq: Once | INTRAMUSCULAR | Status: AC
  Administered 2021-05-28: 50 ug via INTRAVENOUS
  Filled 2021-05-28: qty 1

## 2021-05-28 MED ORDER — METHOCARBAMOL 500 MG PO TABS
1000.0000 mg | ORAL_TABLET | Freq: Once | ORAL | Status: AC
  Administered 2021-05-28: 1000 mg via ORAL
  Filled 2021-05-28: qty 2

## 2021-05-28 MED ORDER — HYDROMORPHONE HCL 1 MG/ML IJ SOLN
0.5000 mg | Freq: Once | INTRAMUSCULAR | Status: AC
Start: 2021-05-28 — End: 2021-05-28
  Administered 2021-05-28: 0.5 mg via INTRAVENOUS
  Filled 2021-05-28: qty 1

## 2021-05-28 MED ORDER — ACETAMINOPHEN 500 MG PO TABS
1000.0000 mg | ORAL_TABLET | Freq: Once | ORAL | Status: AC
  Administered 2021-05-28: 1000 mg via ORAL
  Filled 2021-05-28: qty 2

## 2021-05-28 MED ORDER — OXYCODONE HCL 5 MG PO TABS
5.0000 mg | ORAL_TABLET | Freq: Once | ORAL | Status: AC
  Administered 2021-05-28: 5 mg via ORAL
  Filled 2021-05-28: qty 1

## 2021-05-28 NOTE — ED Notes (Signed)
Pt updated family 

## 2021-05-28 NOTE — ED Provider Notes (Signed)
I received the patient in transfer from [facility].  Briefly the patient is a 60 year old male with a history of back surgery who came in with a chief complaints of leg spasms.  When I discussed this with him he tells me he has been having the symptoms for at least a couple years but feels like its gotten worse over the past couple weeks or so.  He feels like he has had worsening of his low back pain on the right.  He denies recurrent trauma.  Denies fevers denies loss of bowel or bladder.  MRI has resulted and is negative for acute pathology.  On my physical exam the patient's spasm seems inconsistent.  Multiple assessments with spasm.  He has no hyperreflexia for me.  No clonus.  Negative Babinski.  Normal reflexes.  We will have him follow-up with neurosurgery as well as his family doctor.     Melene Plan, DO 05/28/21 2231

## 2021-05-28 NOTE — ED Notes (Signed)
RN Reviewed discharge instructions w/ pt. Follow up and pain management reviewed, pt had no further questions

## 2021-05-28 NOTE — ED Notes (Signed)
Patient transported to MRI 

## 2021-05-28 NOTE — Plan of Care (Signed)
Curb sided about imaging recommendations for this 60 year old male with worsening bilateral lower extremity weakness.  Has known cervical spine stenosis s/p fusion and myelomalacia, as well as prior lumbar spine surgeries and significant chronic alcohol use per chart review.  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 We will increase his Lyrica to 2 tablets of the 330 mg CRs if not successful will back off of 1 and start Vimpat. Discussed the possibility of doing a spinal cord stimulator trial but he is very needle phobic would not even consent to the needle exam during the EMG.  05/13/2021 outpatient neurology note reviewed: CERVICAL POST LAMINECTOMY SYNDROME/POST LAMINECTOMY LUMBAR: Advised that he may benefit from Physical Therapy and would advise him to try and get a walking cane to help him walk more upright.  Discussed that we can add a medication called Vimpat 50 mg BID for one month then taper up to 100 mg QD. Continue the Lyrica for now. Can condiser Baclofen at a later date if needed.   Neurology available if needed pending imaging results

## 2021-05-28 NOTE — ED Notes (Signed)
Report called to Alycia Rossetti RN at [facility] ED.

## 2021-05-28 NOTE — ED Triage Notes (Signed)
Pt c/o lower back pain and neuropathy to bilat LE with spasms-pt with jerking motion to both legs to triage in w/c

## 2021-05-28 NOTE — ED Notes (Signed)
Calm, resting quietly.

## 2021-05-28 NOTE — Discharge Instructions (Signed)
Your MRI did not show any acute abnormality.  This is good news.  Please follow-up with your family doctor and follow-up with neurosurgery in the office.  Please return for worsening or if you feel like he cannot feel your bottom when he wiped with toilet paper if you are having trouble urinating or develop a fever.  Take 4 over the counter ibuprofen tablets 3 times a day or 2 over-the-counter naproxen tablets twice a day for pain. Also take tylenol 1000mg (2 extra strength) four times a day.    Follow up with your neurosurgeon.

## 2021-05-28 NOTE — ED Notes (Signed)
Pt arrives to [facility] ED from [facility] for MRI. Pt reports numbness, shooting pains in bilateral legs, hx multiple back surgeries. Ambulatory w/ pain, 20g L AC

## 2021-05-28 NOTE — ED Provider Notes (Signed)
[facility] EMERGENCY DEPARTMENT Provider Note   CSN: [id number] Arrival date & time: 05/28/21  1325     History Chief Complaint  Patient presents with   Back Pain    Brandon Randolph is a 60 y.o. male presenting for evaluation of back pain, increasing leg numbness, and increasing spasm.  Patient states he had back surgery many years ago.  For several years he has had issues with neuropathy, is on pregabalin for this without improvement.  Today he has increasing numbness, especially of his distal lower extremities bilaterally.  He also reports increased leg spasms/jerking which originates in his back and is accompanied with a shooting pain.  He has had the spasms before, but never to this frequency.  He denies new fall, trauma, or injury.  He denies fevers, chest pain, shortness breath, cough, nausea, vomiting, abdominal pain.  He is having interruption to his urinary stream, but no other urinary symptoms.  No normal bowel movements.  No loss of bowel bladder control.  Additional history obtained in chart review.  Patient with a history of lumbar and cervical surgery followed by neurology.  Per recent neurology note, patient is on Lyrica with a plan to start Vimpat for worsening neuropathy, however patient does not have this medicine at this time.    HPI     Past Medical History:  Diagnosis Date   Diabetes mellitus without complication (HCC)    Neuropathy     There are no problems to display for this patient.   Past Surgical History:  Procedure Laterality Date   BACK SURGERY     NECK SURGERY         No family history on file.  Social History   Tobacco Use   Smoking status: Some Days    Types: Cigars   Smokeless tobacco: Never  Vaping Use   Vaping Use: Never used  Substance Use Topics   Alcohol use: Yes    Comment: occ   Drug use: Yes    Types: Marijuana    Home Medications Prior to Admission medications   Not on File    Allergies    Contrast  media [iodinated diagnostic agents]  Review of Systems   Review of Systems  Musculoskeletal:  Positive for back pain.  Neurological:  Positive for numbness.  All other systems reviewed and are negative.  Physical Exam Updated Vital Signs BP (!) 120/91 (BP Location: Right Arm)   Pulse 78   Temp 97.9 F (36.6 C) (Oral)   Resp 18   Ht 6\' 1"  (1.854 m)   Wt 74.8 kg   SpO2 98%   BMI 21.77 kg/m   Physical Exam Vitals and nursing note reviewed.  Constitutional:      General: He is not in acute distress.    Appearance: Normal appearance.     Comments: nontoxic  HENT:     Head: Normocephalic and atraumatic.  Eyes:     Conjunctiva/sclera: Conjunctivae normal.     Pupils: Pupils are equal, round, and reactive to light.  Cardiovascular:     Rate and Rhythm: Normal rate and regular rhythm.     Pulses: Normal pulses.  Pulmonary:     Effort: Pulmonary effort is normal. No respiratory distress.     Breath sounds: Normal breath sounds. No wheezing.     Comments: Speaking in full sentences.  Clear lung sounds in all fields. Abdominal:     General: There is no distension.     Palpations: Abdomen is  soft. There is no mass.     Tenderness: There is no abdominal tenderness. There is no guarding or rebound.  Musculoskeletal:        General: Normal range of motion.     Cervical back: Normal range of motion and neck supple.     Comments: Frequent spasms/jerking of bilateral lower ext.  Decreased sensation of bilateral lower extremities, worse distal to the knee but equal bilaterally.  No sensation to soft touch of the distal lower extremities.  Decreased sensation globally of the lower extremities elsewhere.  No saddle anesthesia.  Patellar reflexes not appreciable bilaterally.  Strength diminished but equal bilaterally. No tenderness palpation of back or midline spine. Able to straight leg raise bilaterally   Skin:    General: Skin is warm and dry.     Capillary Refill: Capillary refill  takes less than 2 seconds.  Neurological:     Mental Status: He is alert and oriented to person, place, and time.  Psychiatric:        Mood and Affect: Mood and affect normal.        Speech: Speech normal.        Behavior: Behavior normal.    ED Results / Procedures / Treatments   Labs (all labs ordered are listed, but only abnormal results are displayed) Labs Reviewed  CBC WITH DIFFERENTIAL/PLATELET  COMPREHENSIVE METABOLIC PANEL  MAGNESIUM    EKG None  Radiology No results found.  Procedures Procedures   Medications Ordered in ED Medications  methocarbamol (ROBAXIN) tablet 1,000 mg (1,000 mg Oral Given 05/28/21 1444)  fentaNYL (SUBLIMAZE) injection 50 mcg (50 mcg Intravenous Given 05/28/21 1445)    ED Course  I have reviewed the triage vital signs and the nursing notes.  Pertinent labs & imaging results that were available during my care of the patient were reviewed by me and considered in my medical decision making (see chart for details).    MDM Rules/Calculators/A&P                           Patient presenting for evaluation of worsening pain, numbness, and jerking of bilateral lower extremities.  On exam, patient has frequent jerking noted of bilateral extremities.  Diffuse weakness.  Decree sensation bilaterally.  Will obtain labs and CT.  Labs interpreted by me, overall reassuring.  CT negative for acute findings.  In the setting of new neurologic deficits, patient will likely need MRI.  Will consult with neurology as patient will need to be transferred and likely evaluated by neurology.  Discussed with Dr. Iver Nestle from neurology at [facility].  Recommends MRI T-spine and C-spine without contrast.  Recommends ethanol, ammonia, B12, UA, UDS.  Discussed with Dr. Adela Lank from [facility] who accepts patient for transfer.  Discussed plans with patient and patient's mom, they are requesting [facility] transfer as patient's mom does not feel comfortable  transporting him at this time.   Final Clinical Impression(s) / ED Diagnoses Final diagnoses:  None    Rx / DC Orders ED Discharge Orders     None        Alveria Apley, PA-C 05/28/21 1559    Gloris Manchester, MD 05/29/21 1539

## 2021-05-28 NOTE — ED Notes (Signed)
Pt. Transferred via [facility]. 

## 2022-09-17 ENCOUNTER — Emergency Department (HOSPITAL_BASED_OUTPATIENT_CLINIC_OR_DEPARTMENT_OTHER): Payer: No Typology Code available for payment source

## 2022-09-17 ENCOUNTER — Emergency Department (HOSPITAL_BASED_OUTPATIENT_CLINIC_OR_DEPARTMENT_OTHER)
Admission: EM | Admit: 2022-09-17 | Discharge: 2022-09-17 | Disposition: A | Discharge status: Home / Self Care | Payer: No Typology Code available for payment source | Attending: Emergency Medicine | Admitting: Emergency Medicine

## 2022-09-17 ENCOUNTER — Encounter (HOSPITAL_BASED_OUTPATIENT_CLINIC_OR_DEPARTMENT_OTHER): Payer: Self-pay

## 2022-09-17 ENCOUNTER — Other Ambulatory Visit: Payer: Self-pay

## 2022-09-17 DIAGNOSIS — G8929 Other chronic pain: Secondary | ICD-10-CM | POA: Insufficient documentation

## 2022-09-17 DIAGNOSIS — M545 Low back pain, unspecified: Secondary | ICD-10-CM

## 2022-09-17 DIAGNOSIS — Y92410 Unspecified street and highway as the place of occurrence of the external cause: Secondary | ICD-10-CM | POA: Diagnosis not present

## 2022-09-17 MED ORDER — LIDOCAINE 5 % EX PTCH: 1.0000 | MEDICATED_PATCH | CUTANEOUS | 0 refills | Status: DC

## 2022-09-17 MED ORDER — KETOROLAC TROMETHAMINE 60 MG/2ML IM SOLN
15.0000 mg | Freq: Once | INTRAMUSCULAR | Status: AC
  Administered 2022-09-17: 15 mg via INTRAMUSCULAR
  Filled 2022-09-17: qty 2

## 2022-09-17 MED ORDER — LIDOCAINE 5 % EX PTCH
1.0000 | MEDICATED_PATCH | CUTANEOUS | Status: DC
  Administered 2022-09-17: 1 via TRANSDERMAL
  Filled 2022-09-17: qty 1

## 2022-09-17 MED ORDER — KETOROLAC TROMETHAMINE 15 MG/ML IJ SOLN: 15.0000 mg | Freq: Once | INTRAMUSCULAR | Status: DC
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
## 2022-10-30 ENCOUNTER — Other Ambulatory Visit: Payer: Self-pay

## 2022-10-30 ENCOUNTER — Encounter (HOSPITAL_BASED_OUTPATIENT_CLINIC_OR_DEPARTMENT_OTHER): Payer: Self-pay | Admitting: Emergency Medicine

## 2022-10-30 ENCOUNTER — Emergency Department (HOSPITAL_BASED_OUTPATIENT_CLINIC_OR_DEPARTMENT_OTHER)
Admission: EM | Admit: 2022-10-30 | Discharge: 2022-10-31 | Disposition: A | Discharge status: Home / Self Care | Payer: Medicare HMO | Attending: Emergency Medicine | Admitting: Emergency Medicine

## 2022-10-30 ENCOUNTER — Emergency Department (HOSPITAL_COMMUNITY): Payer: Medicare HMO

## 2022-10-30 DIAGNOSIS — R531 Weakness: Secondary | ICD-10-CM | POA: Insufficient documentation

## 2022-10-30 DIAGNOSIS — E1140 Type 2 diabetes mellitus with diabetic neuropathy, unspecified: Secondary | ICD-10-CM | POA: Diagnosis not present

## 2022-10-30 DIAGNOSIS — I10 Essential (primary) hypertension: Secondary | ICD-10-CM | POA: Diagnosis not present

## 2022-10-30 DIAGNOSIS — M545 Low back pain, unspecified: Secondary | ICD-10-CM | POA: Insufficient documentation

## 2022-10-30 LAB — URINALYSIS, ROUTINE W REFLEX MICROSCOPIC
Bilirubin Urine: NEGATIVE
Glucose, UA: NEGATIVE mg/dL
Ketones, ur: NEGATIVE mg/dL
Leukocytes,Ua: NEGATIVE
Nitrite: NEGATIVE
Protein, ur: NEGATIVE mg/dL
Specific Gravity, Urine: 1.025 (ref 1.005–1.030)
pH: 6.5 (ref 5.0–8.0)

## 2022-10-30 LAB — CBC WITH DIFFERENTIAL/PLATELET
Abs Immature Granulocytes: 0.01 10*3/uL (ref 0.00–0.07)
Basophils Absolute: 0 10*3/uL (ref 0.0–0.1)
Basophils Relative: 0 %
Eosinophils Absolute: 0.1 10*3/uL (ref 0.0–0.5)
Eosinophils Relative: 2 %
HCT: 43.6 % (ref 39.0–52.0)
Hemoglobin: 15 g/dL (ref 13.0–17.0)
Immature Granulocytes: 0 %
Lymphocytes Relative: 42 %
Lymphs Abs: 1.7 10*3/uL (ref 0.7–4.0)
MCH: 32.2 pg (ref 26.0–34.0)
MCHC: 34.4 g/dL (ref 30.0–36.0)
MCV: 93.6 fL (ref 80.0–100.0)
Monocytes Absolute: 0.5 10*3/uL (ref 0.1–1.0)
Monocytes Relative: 11 %
Neutro Abs: 1.8 10*3/uL (ref 1.7–7.7)
Neutrophils Relative %: 45 %
Platelets: 158 10*3/uL (ref 150–400)
RBC: 4.66 MIL/uL (ref 4.22–5.81)
RDW: 13.3 % (ref 11.5–15.5)
WBC: 4.1 10*3/uL (ref 4.0–10.5)
nRBC: 0 % (ref 0.0–0.2)

## 2022-10-30 LAB — COMPREHENSIVE METABOLIC PANEL
ALT: 10 U/L (ref 0–44)
AST: 20 U/L (ref 15–41)
Albumin: 3.9 g/dL (ref 3.5–5.0)
Alkaline Phosphatase: 57 U/L (ref 38–126)
Anion gap: 6 (ref 5–15)
BUN: 15 mg/dL (ref 8–23)
CO2: 25 mmol/L (ref 22–32)
Calcium: 9.1 mg/dL (ref 8.9–10.3)
Chloride: 107 mmol/L (ref 98–111)
Creatinine, Ser: 0.94 mg/dL (ref 0.61–1.24)
GFR, Estimated: >60 mL/min (ref >60)
Glucose, Bld: 94 mg/dL (ref 70–99)
Potassium: 3.6 mmol/L (ref 3.5–5.1)
Sodium: 138 mmol/L (ref 135–145)
Total Bilirubin: 0.9 mg/dL (ref 0.3–1.2)
Total Protein: 7.4 g/dL (ref 6.5–8.1)

## 2022-10-30 LAB — URINALYSIS, MICROSCOPIC (REFLEX)

## 2022-10-30 MED ORDER — HYDROMORPHONE HCL 1 MG/ML IJ SOLN
1.0000 mg | INTRAMUSCULAR | Status: DC | PRN
  Filled 2022-10-30: qty 1

## 2022-10-30 MED ORDER — HYDROMORPHONE HCL 1 MG/ML IJ SOLN
1.0000 mg | Freq: Once | INTRAMUSCULAR | Status: AC
  Administered 2022-10-30: 1 mg via INTRAVENOUS
  Filled 2022-10-30: qty 1

## 2022-10-30 MED ORDER — METHOCARBAMOL 500 MG PO TABS
750.0000 mg | ORAL_TABLET | Freq: Once | ORAL | Status: AC
  Administered 2022-10-31: 750 mg via ORAL
  Filled 2022-10-30: qty 2

## 2022-10-30 MED ORDER — DEXAMETHASONE SODIUM PHOSPHATE 10 MG/ML IJ SOLN
10.0000 mg | Freq: Once | INTRAMUSCULAR | Status: AC
  Administered 2022-10-31: 10 mg via INTRAVENOUS
  Filled 2022-10-30: qty 1

## 2022-10-30 MED ORDER — KETOROLAC TROMETHAMINE 15 MG/ML IJ SOLN
15.0000 mg | Freq: Once | INTRAMUSCULAR | Status: AC
  Administered 2022-10-31: 15 mg via INTRAVENOUS
  Filled 2022-10-30: qty 1

## 2022-10-30 NOTE — ED Notes (Signed)
[facility] taking over care of patient at this time. Pt in no acute distress upon departure to [facility] ED.

## 2022-10-30 NOTE — ED Triage Notes (Signed)
Pt arrived from [facility] with back pain in need of scans

## 2022-10-30 NOTE — ED Triage Notes (Addendum)
Pt c/o low back pain onset yesterday that occurred after sneezing. Pt has history of multiple back surgeries. Patient fell while walking to the bathroom this morning.

## 2022-10-30 NOTE — ED Notes (Addendum)
Per Billy Fischer, MD: Sherry Ruffing, MD is accepting physician at [facility] ED.

## 2022-10-30 NOTE — ED Notes (Signed)
Pt states he is unable to provide urine sample. Urinal given to pt.

## 2022-10-30 NOTE — ED Provider Notes (Signed)
[facility] Provider Note   CSN: [id number] Arrival date & time: 10/30/22  1117     History  Chief Complaint  Patient presents with   Back Pain    Brandon Randolph is a 62 y.o. male.  HPI        62 year old male with a history of hypertension, hyperlipidemia, prior alcohol abuse, B12 deficiency, thoracic ascending aortic aneurysm, prior polysubstance abuse, history of cervical and lumbar fusions, postlaminectomy syndrome, cervical cord myelomalacia malacia, bilateral numbness of his feet, who presents with concern for back pain, weakness and urinary hesitancy.   Reports his prior surgeries were preceded by a sneeze leading to disc herniation.  Reports he has had back pain intermittently, however had a new episode of severe back pain beginning yesterday after he sneezed.  Reports that he sneezed and he had suddenly had right-sided low back pain.  The pain has been increasing.  It is worse when he moves.  He used to have prescription for muscle relaxants, but no longer has that.  He tried Tylenol without relief.  This morning, he was attempting to walk to the bathroom, and he fell as his right leg gave out on him.  Reports that he did not hurt himself when he fell but fell into the bed.  He feels that both of his lower extremities are weak.  He has had difficulty urinating, but denies any overflow incontinence, or stool incontinence.  Denies a change in the numbness that he had previously in his feet but notes new altered sensation of his thighs.  Denies fevers, dysuria, abdominal pain, cancer history.  He does report he used IV drugs 12 years ago but has had no recent use.     Past Medical History:  Diagnosis Date   Diabetes mellitus without complication ([place])    Neuropathy     Past Surgical History:  Procedure Laterality Date   BACK SURGERY     NECK SURGERY      Home Medications Prior to Admission medications   Medication Sig  Start Date End Date Taking? Authorizing Provider  lidocaine (LIDODERM) 5 % Place 1 patch onto the skin daily. Remove & Discard patch within 12 hours or as directed by MD 09/17/22   Sherrye Payor A, PA-C      Allergies    Contrast media [iodinated contrast media]    Review of Systems   Review of Systems  Physical Exam Updated Vital Signs BP (!) 146/109   Pulse (!) 59   Temp 97.7 F (36.5 C) (Oral)   Resp 18   Ht '6\' 1"'$  (1.854 m)   Wt 72.6 kg   SpO2 98%   BMI 21.11 kg/m  Physical Exam Vitals and nursing note reviewed.  Constitutional:      General: He is not in acute distress.    Appearance: Normal appearance. He is not ill-appearing, toxic-appearing or diaphoretic.  HENT:     Head: Normocephalic.  Eyes:     Conjunctiva/sclera: Conjunctivae normal.  Cardiovascular:     Rate and Rhythm: Normal rate and regular rhythm.     Pulses: Normal pulses.  Pulmonary:     Effort: Pulmonary effort is normal. No respiratory distress.  Abdominal:     General: There is no distension.     Tenderness: There is no abdominal tenderness.  Musculoskeletal:        General: No deformity or signs of injury.     Cervical back: No rigidity.  Skin:  General: Skin is warm and dry.     Coloration: Skin is not jaundiced or pale.  Neurological:     General: No focal deficit present.     Mental Status: He is alert and oriented to person, place, and time.     Sensory: Sensory deficit (reports bilateral thighs altered, lateral side of foot chronic numbness bilaterally) present.     Motor: Weakness (weakness with knee extension, dorsiflexion, milder weakness hip flexion, knee flexion however present) present.     ED Results / Procedures / Treatments   Labs (all labs ordered are listed, but only abnormal results are displayed) Labs Reviewed  CBC WITH DIFFERENTIAL/PLATELET  COMPREHENSIVE METABOLIC PANEL  URINALYSIS, ROUTINE W REFLEX MICROSCOPIC    EKG None  Radiology No results  found.  Procedures Procedures    Medications Ordered in ED Medications  HYDROmorphone (DILAUDID) injection 1 mg (1 mg Intravenous Given 10/30/22 1411)    ED Course/ Medical Decision Making/ A&P                               62 year old male with a history of hypertension, hyperlipidemia, prior alcohol abuse, B12 deficiency, thoracic ascending aortic aneurysm, prior polysubstance abuse, history of cervical and lumbar fusions, postlaminectomy syndrome, cervical cord myelomalacia malacia, bilateral numbness of his feet, who presents with concern for back pain, weakness and urinary hesitancy.  Normal pulses bilaterally, low suspicion for acute arterial thrombus, no upper pain, pain worse with movements, low suspicion for dissection.  No abdominal tenderness and doubt intraabdominal etiology of back pain.  Does have weakness, numbness, describes urinary hesitancy (PVR pending) and concern for possible cauda equina. Ordered MRI lumbar to be completed here at [facility] initially but told they do not have scheduling capacity.  Given he had previous surgery at [facility] did discuss with ED possible transfer---upon further review he last saw their office 5 years ago, with surgery more remote than that and his surgeon is no longer there.  They also tell me MRI is available until 7PM at their facility. Discussed with patient and agree to transfer to [facility] for MRI.  UA pending.           Final Clinical Impression(s) / ED Diagnoses Final diagnoses:  Acute right-sided low back pain without sciatica    Rx / DC Orders ED Discharge Orders     None         Gareth Morgan, MD 10/30/22 1558

## 2022-10-31 MED ORDER — PREDNISONE 20 MG PO TABS: 40.0000 mg | ORAL_TABLET | Freq: Every day | ORAL | 0 refills | Status: AC

## 2022-10-31 MED ORDER — METHOCARBAMOL 500 MG PO TABS: 500.0000 mg | ORAL_TABLET | Freq: Three times a day (TID) | ORAL | 0 refills | Status: AC | PRN

## 2022-10-31 MED ORDER — LIDOCAINE 5 % EX PTCH: 1.0000 | MEDICATED_PATCH | CUTANEOUS | 0 refills | Status: AC

## 2022-10-31 NOTE — ED Provider Notes (Signed)
Clinical Course as of 10/31/22 0134  Nancy Fetter Oct 31, 2022  0016 I have reviewed patient's MRI results.  This shows moderate stenosis from T12-L2, though most foraminal narrowing is left-sided and the patient is reporting right sided radiculopathy.  Stenosis at L2/3 is mild.  Do not feel that aforementioned imaging results necessitate urgent/emergent intervention. Patient able to transition in the bed without issue. Subsequently ambulated to the bathroom; walked beside patient to ensure safe ambulation, though patient required minimal assistance. Will give additional dose of pain medication. Also ordered to receive Decadron and Toradol for pain control.  [id number] Patient with improved pain following additional medications. Plan to provide course of steroids to try and help reduce radicular pain. Patient to f/u with neurosurgery.  [KH]    Clinical Course User Index [KH] Antonietta Breach, PA-C    MR LUMBAR SPINE WO CONTRAST  Result Date: 10/30/2022 CLINICAL DATA:  Low back pain, cauda equina syndrome suspected EXAM: MRI LUMBAR SPINE WITHOUT CONTRAST TECHNIQUE: Multiplanar, multisequence MR imaging of the lumbar spine was performed. No intravenous contrast was administered. COMPARISON:  No prior MRI the lumbar spine available. Correlation is made with CT lumbar spine 05/28/2021 and MRI thoracic spine 05/28/2021 FINDINGS: Segmentation:  5 lumbar type vertebral bodies. Alignment: Mild levocurvature. Straightening of the normal lumbar lordosis. No listhesis. Vertebrae: No acute fracture or suspicious osseous lesion. Osseous fusion across the L3-L5 disc spaces and facets, with ghost tracks from prior hardware. Posterior decompression L3-L5. Congenitally short pedicles, which narrow the AP diameter of the spinal canal. Conus medullaris and cauda equina: Conus extends to the L1-L2 level. Conus and cauda equina appear normal. Paraspinal and other soft tissues: Fatty atrophy of the posterior paraspinous musculature,  extending from T12-L1 through the sacrum. Small renal cysts, for which no follow-up is currently indicated. Disc levels: T11-T12: Seen only on the sagittal images. Mild disc bulge. Moderate facet arthropathy. Mild spinal canal stenosis. Moderate right and mild left neural foraminal narrowing. T12-L1: Mild disc bulge with central disc protrusion. Mild facet arthropathy. Narrowing of the lateral recesses. Moderate spinal canal stenosis. No neural foraminal narrowing. L1-L2: Moderate disc bulge with central disc protrusion. Mild facet arthropathy. Moderate spinal canal stenosis. Narrowing of the lateral recesses. Mild left neural foraminal narrowing. L2-L3: Minimal disc bulge with small left subarticular disc protrusion with annular fissure. This narrows the left lateral recess and contacts the descending left L3 nerve roots. Moderate facet arthropathy. Ligamentum flavum hypertrophy. Mild spinal canal stenosis. No neural foraminal narrowing. L3-L4: Status post prior fusion and decompression. No spinal canal stenosis. Mild bilateral neural foraminal narrowing. L4-L5: Status post prior fusion and decompression. No spinal canal stenosis. Mild bilateral neural foraminal narrowing. L5-S1: Minimal disc bulge. Moderate facet arthropathy. No spinal canal stenosis or neural foraminal narrowing. IMPRESSION: 1. T12-L1 and L1-L2 moderate spinal canal stenosis and mild left neural foraminal narrowing. Narrowing of the lateral recesses at these levels likely affects the descending L1 and L2 nerve roots. 2. L2-L3 mild spinal canal stenosis. A left subarticular disc protrusion narrows the left lateral recess and contacts the descending left L3 nerve roots. 3. T11-T12 mild spinal canal stenosis with moderate right and mild left neural foraminal narrowing. 4. Status post prior fusion and decompression at L3-L5, with subsequent hardware removal. Mild bilateral neural foraminal narrowing at these levels, with no residual spinal canal  stenosis. 5. Fatty atrophy of the posterior paraspinous muscles from the level of T12-L1 through the sacrum, which may reflect the sequela of denervation. Electronically Signed  By: Merilyn Baba M.D.   On: 10/30/2022 23:16      Antonietta Breach, PA-C 10/31/22 [id number]    Merrily Pew, MD 10/31/22 [phone]

## 2022-10-31 NOTE — Discharge Instructions (Signed)
Your MRI does not show any findings necessitating admission or immediate surgery. Alternate ice and heat to areas of injury 3-4 times per day to limit inflammation and spasm.  Avoid strenuous activity and heavy lifting.  We recommend prednisone, as prescribed, in addition to Robaxin for muscle spasms.  You have been prescribed Lidoderm patches as well. We recommend follow-up with a primary care doctor as well as neurosurgery to ensure resolution of symptoms.  Return to the ED for any new or concerning symptoms.

## 2022-11-04 ENCOUNTER — Telehealth: Payer: Self-pay

## 2022-11-04 NOTE — Telephone Encounter (Signed)
This RNCM received inbound call from [facility] with [place] calling to verify dosage for prednisone. Per chart review, this RNCM provided verbal read back based on the below instructions.  predniSONE 40 mg Oral Daily, Take 40 mg by mouth daily for 3 days, then '20mg'$  by mouth daily for 3 days, then '10mg'$  daily for 3 days   No additional TOC needs.
